# Patient Record
Sex: Male | Born: 1995 | Race: White | Hispanic: No | Marital: Married | Smoking: Never smoker
Health system: Southern US, Community
[De-identification: ages and names within clinical notes are randomized; demographics above are authoritative.]

## PROBLEM LIST (undated history)

## (undated) DIAGNOSIS — Z8669 Personal history of other diseases of the nervous system and sense organs: Secondary | ICD-10-CM

## (undated) DIAGNOSIS — T7840XA Allergy, unspecified, initial encounter: Secondary | ICD-10-CM

## (undated) DIAGNOSIS — D234 Other benign neoplasm of skin of scalp and neck: Secondary | ICD-10-CM

## (undated) HISTORY — PX: TUMOR REMOVAL: SHX12

## (undated) HISTORY — PX: SKIN BIOPSY: SHX1

## (undated) HISTORY — DX: Allergy, unspecified, initial encounter: T78.40XA

## (undated) HISTORY — DX: Other benign neoplasm of skin of scalp and neck: D23.4

## (undated) HISTORY — PX: TYMPANOSTOMY TUBE PLACEMENT: SHX32

---
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
## 2013-05-28 ENCOUNTER — Ambulatory Visit: Payer: Self-pay | Admitting: Emergency Medicine

## 2013-07-21 ENCOUNTER — Emergency Department: Payer: Self-pay | Admitting: Emergency Medicine

## 2013-07-21 LAB — URINALYSIS, COMPLETE
Bacteria: NONE SEEN
Bilirubin,UR: NEGATIVE
Blood: NEGATIVE
Glucose,UR: NEGATIVE mg/dL (ref 0–75)
Ketone: NEGATIVE
Leukocyte Esterase: NEGATIVE
Nitrite: NEGATIVE
PROTEIN: NEGATIVE
Ph: 5 (ref 4.5–8.0)
RBC,UR: <1 /HPF (ref 0–5)
SPECIFIC GRAVITY: 1.024 (ref 1.003–1.030)
SQUAMOUS EPITHELIAL: NONE SEEN
WBC UR: 1 /HPF (ref 0–5)

## 2013-08-03 ENCOUNTER — Ambulatory Visit: Payer: Self-pay | Admitting: Physician Assistant

## 2014-08-06 ENCOUNTER — Ambulatory Visit
Admission: EM | Admit: 2014-08-06 | Discharge: 2014-08-06 | Disposition: A | Discharge status: Home / Self Care | Payer: BLUE CROSS/BLUE SHIELD | Attending: Family Medicine | Admitting: Family Medicine

## 2014-08-06 ENCOUNTER — Ambulatory Visit: Payer: BLUE CROSS/BLUE SHIELD

## 2014-08-06 ENCOUNTER — Other Ambulatory Visit: Payer: Self-pay

## 2014-08-06 DIAGNOSIS — Z79899 Other long term (current) drug therapy: Secondary | ICD-10-CM | POA: Insufficient documentation

## 2014-08-06 DIAGNOSIS — M940 Chondrocostal junction syndrome [Tietze]: Secondary | ICD-10-CM | POA: Insufficient documentation

## 2014-08-06 DIAGNOSIS — R079 Chest pain, unspecified: Secondary | ICD-10-CM | POA: Diagnosis present

## 2014-08-06 DIAGNOSIS — R0789 Other chest pain: Secondary | ICD-10-CM | POA: Diagnosis not present

## 2014-08-06 LAB — CKMB (ARMC ONLY): CK, MB: 1.3 ng/mL (ref 0.5–5.0)

## 2014-08-06 LAB — CBC WITH DIFFERENTIAL/PLATELET
BASOS ABS: 0 10*3/uL (ref 0–0.1)
Basophils Relative: 1 %
Eosinophils Absolute: 0.1 10*3/uL (ref 0–0.7)
Eosinophils Relative: 1 %
HCT: 46.2 % (ref 40.0–52.0)
HEMOGLOBIN: 15.8 g/dL (ref 13.0–18.0)
Lymphocytes Relative: 31 %
Lymphs Abs: 2.5 10*3/uL (ref 1.0–3.6)
MCH: 30.6 pg (ref 26.0–34.0)
MCHC: 34.2 g/dL (ref 32.0–36.0)
MCV: 89.5 fL (ref 80.0–100.0)
MONOS PCT: 10 %
Monocytes Absolute: 0.8 10*3/uL (ref 0.2–1.0)
NEUTROS ABS: 4.7 10*3/uL (ref 1.4–6.5)
Neutrophils Relative %: 57 %
PLATELETS: 275 10*3/uL (ref 150–440)
RBC: 5.16 MIL/uL (ref 4.40–5.90)
RDW: 13.8 % (ref 11.5–14.5)
WBC: 8.2 10*3/uL (ref 3.8–10.6)

## 2014-08-06 LAB — COMPREHENSIVE METABOLIC PANEL
ALK PHOS: 106 U/L (ref 38–126)
ALT: 131 U/L — AB (ref 17–63)
AST: 44 U/L — ABNORMAL HIGH (ref 15–41)
Albumin: 4.5 g/dL (ref 3.5–5.0)
Anion gap: 9 (ref 5–15)
BUN: 16 mg/dL (ref 6–20)
CO2: 26 mmol/L (ref 22–32)
Calcium: 9.8 mg/dL (ref 8.9–10.3)
Chloride: 105 mmol/L (ref 101–111)
Creatinine, Ser: 0.91 mg/dL (ref 0.61–1.24)
GFR calc Af Amer: >60 mL/min (ref >60)
GFR calc non Af Amer: >60 mL/min (ref >60)
Glucose, Bld: 95 mg/dL (ref 65–99)
Potassium: 4.2 mmol/L (ref 3.5–5.1)
Sodium: 140 mmol/L (ref 135–145)
Total Bilirubin: 0.5 mg/dL (ref 0.3–1.2)
Total Protein: 7.4 g/dL (ref 6.5–8.1)

## 2014-08-06 LAB — CK: Total CK: 90 U/L (ref 49–397)

## 2014-08-06 LAB — TROPONIN I: Troponin I: <0.03 ng/mL (ref <0.031)

## 2014-08-06 MED ORDER — KETOROLAC TROMETHAMINE 60 MG/2ML IM SOLN
60.0000 mg | Freq: Once | INTRAMUSCULAR | Status: AC
  Administered 2014-08-06: 60 mg via INTRAMUSCULAR

## 2014-08-06 MED ORDER — MELOXICAM 15 MG PO TABS: 15.0000 mg | ORAL_TABLET | Freq: Every day | ORAL | Status: DC

## 2014-08-06 NOTE — ED Provider Notes (Signed)
CSN: [id number]     Arrival date & time 08/06/14  1629 History   First MD Initiated Contact with Patient 08/06/14 1731     Chief Complaint  Patient presents with  . Chest Pain   (Consider location/radiation/quality/duration/timing/severity/associated sxs/prior Treatment) Patient is a 19 y.o. male presenting with chest pain. The history is provided by the patient. No language interpreter was used.  Chest Pain Pain location:  Substernal area Pain quality: sharp, shooting and stabbing   Pain radiates to:  Does not radiate Pain radiates to the back: no   Pain severity:  Moderate Onset quality:  Sudden Duration:  9 hours Timing:  Constant Progression:  Unchanged Chronicity:  New Context: breathing and movement   Relieved by:  Nothing Worsened by:  Deep breathing, movement and exertion Ineffective treatments:  None tried Associated symptoms: no abdominal pain, no AICD problem, no anorexia, no anxiety, no dizziness, no fatigue, no headache, no heartburn, no palpitations and no shortness of breath   Risk factors: male sex   Risk factors: no aortic disease, no coronary artery disease, no Ehlers-Danlos syndrome, no high cholesterol, no hypertension and no smoking    Patient denies any pertinent medical problems. States the chest pain started this morning. He went to work and was able to finish his shift work today. He works as Dealer. Initially when he presented he told the nurse he was going to sue her if treatment wasn't more prompt. History reviewed. No pertinent past medical history. History reviewed. No pertinent past surgical history. History reviewed. No pertinent family history. History  Substance Use Topics  . Smoking status: Never Smoker   . Smokeless tobacco: Not on file  . Alcohol Use: No    Review of Systems  Constitutional: Negative for fatigue.  Respiratory: Negative for shortness of breath.   Cardiovascular: Positive for chest pain. Negative for palpitations.    Gastrointestinal: Negative for heartburn, abdominal pain and anorexia.  Neurological: Negative for dizziness and headaches.  All other systems reviewed and are negative.   Allergies  Review of patient's allergies indicates no known allergies.  Home Medications   Prior to Admission medications   Medication Sig Start Date End Date Taking? Authorizing Provider  cetirizine (ZYRTEC) 10 MG tablet Take 10 mg by mouth daily.   Yes Historical Provider, MD  fluticasone (FLONASE) 50 MCG/ACT nasal spray Place into both nostrils daily.   Yes Historical Provider, MD  loratadine (CLARITIN) 10 MG tablet Take 10 mg by mouth daily.   Yes Historical Provider, MD  meloxicam (MOBIC) 15 MG tablet Take 1 tablet (15 mg total) by mouth daily. 08/06/14   Frederich Cha, MD   BP 135/107 mmHg  Pulse 112  Temp(Src) 98 F (36.7 C) (Oral)  Resp 17  Ht 5\' 9"  (1.753 m)  Wt 260 lb (117.935 kg)  BMI 38.38 kg/m2  SpO2 99% Physical Exam  Constitutional: He appears well-developed.  Obese white male  HENT:  Head: Normocephalic and atraumatic.  Eyes: Pupils are equal, round, and reactive to light.  Neck: Normal range of motion. No tracheal deviation present. No thyromegaly present.  Cardiovascular: Normal rate, regular rhythm, S1 normal, S2 normal and normal heart sounds.   Pulmonary/Chest: Breath sounds normal. No accessory muscle usage. No respiratory distress. He has no decreased breath sounds. He has no wheezes. He exhibits tenderness.    Musculoskeletal: Normal range of motion. He exhibits no edema.  Lymphadenopathy:    He has no cervical adenopathy.  Neurological: He is alert.  Skin:  Skin is warm and dry.  Psychiatric: His behavior is normal.  Vitals reviewed.   ED Course  Procedures (including critical care time) Labs Review Labs Reviewed  COMPREHENSIVE METABOLIC PANEL - Abnormal; Notable for the following:    AST 44 (*)    ALT 131 (*)    All other components within normal limits  CBC WITH  DIFFERENTIAL/PLATELET  TROPONIN I  CKMB([facility] ONLY)  CK   Results for orders placed or performed during the hospital encounter of 08/06/14  Comprehensive metabolic panel  Result Value Ref Range   Sodium 140 135 - 145 mmol/L   Potassium 4.2 3.5 - 5.1 mmol/L   Chloride 105 101 - 111 mmol/L   CO2 26 22 - 32 mmol/L   Glucose, Bld 95 65 - 99 mg/dL   BUN 16 6 - 20 mg/dL   Creatinine, Ser 0.91 0.61 - 1.24 mg/dL   Calcium 9.8 8.9 - 10.3 mg/dL   Total Protein 7.4 6.5 - 8.1 g/dL   Albumin 4.5 3.5 - 5.0 g/dL   AST 44 (H) 15 - 41 U/L   ALT 131 (H) 17 - 63 U/L   Alkaline Phosphatase 106 38 - 126 U/L   Total Bilirubin 0.5 0.3 - 1.2 mg/dL   GFR calc non Af Amer >60 >60 mL/min   GFR calc Af Amer >60 >60 mL/min   Anion gap 9 5 - 15  CBC with Differential  Result Value Ref Range   WBC 8.2 3.8 - 10.6 K/uL   RBC 5.16 4.40 - 5.90 MIL/uL   Hemoglobin 15.8 13.0 - 18.0 g/dL   HCT 46.2 40.0 - 52.0 %   MCV 89.5 80.0 - 100.0 fL   MCH 30.6 26.0 - 34.0 pg   MCHC 34.2 32.0 - 36.0 g/dL   RDW 13.8 11.5 - 14.5 %   Platelets 275 150 - 440 K/uL   Neutrophils Relative % 57 %   Neutro Abs 4.7 1.4 - 6.5 K/uL   Lymphocytes Relative 31 %   Lymphs Abs 2.5 1.0 - 3.6 K/uL   Monocytes Relative 10 %   Monocytes Absolute 0.8 0.2 - 1.0 K/uL   Eosinophils Relative 1 %   Eosinophils Absolute 0.1 0 - 0.7 K/uL   Basophils Relative 1 %   Basophils Absolute 0.0 0 - 0.1 K/uL  Troponin I  Result Value Ref Range   Troponin I <0.03 <0.031 ng/mL  CKMB([facility] only)  Result Value Ref Range   CK, MB 1.3 0.5 - 5.0 ng/mL  CK  Result Value Ref Range   Total CK 90 49 - 397 U/L    Imaging Review Dg Chest 2 View  08/06/2014   CLINICAL DATA:  Upper anterior chest pain.  Shortness of breath.  EXAM: CHEST  2 VIEW  COMPARISON:  None.  FINDINGS: The heart size and mediastinal contours are within normal limits. Both lungs are clear. The visualized skeletal structures are unremarkable.  IMPRESSION: Normal chest.   Electronically  Signed   By: Lorriane Shire M.D.   On: 08/06/2014 17:35   EKG shows normal sinus rhythm no abnormalities. 60 Toradol IM will be given to see if improvement pain occurs. Will also get cardiac enzymes to make sure nothing was missed. The patient appears to have costochondritis and will need to be placed on anti-inflammatory.   Patient showed marked improvement with 60 mg Toradol IM.    MDM   1. Costochondritis, acute   2. Chest wall pain  Frederich Cha, MD 08/06/14 1901

## 2014-08-06 NOTE — ED Notes (Signed)
C/o upper anterior chest pain since this morning. Non radiating. States is SOB. Color pink, skin warm and dry.

## 2014-08-06 NOTE — Discharge Instructions (Signed)
Chest Pain (Nonspecific) It is often hard to give a diagnosis for the cause of chest pain. There is always a chance that your pain could be related to something serious, such as a heart attack or a blood clot in the lungs. You need to follow up with your doctor. HOME CARE  If antibiotic medicine was given, take it as directed by your doctor. Finish the medicine even if you start to feel better.  For the next few days, avoid activities that bring on chest pain. Continue physical activities as told by your doctor.  Do not use any tobacco products. This includes cigarettes, chewing tobacco, and e-cigarettes.  Avoid drinking alcohol.  Only take medicine as told by your doctor.  Follow your doctor's suggestions for more testing if your chest pain does not go away.  Keep all doctor visits you made. GET HELP IF:  Your chest pain does not go away, even after treatment.  You have a rash with blisters on your chest.  You have a fever. GET HELP RIGHT AWAY IF:   You have more pain or pain that spreads to your arm, neck, jaw, back, or belly (abdomen).  You have shortness of breath.  You cough more than usual or cough up blood.  You have very bad back or belly pain.  You feel sick to your stomach (nauseous) or throw up (vomit).  You have very bad weakness.  You pass out (faint).  You have chills. This is an emergency. Do not wait to see if the problems will go away. Call your local emergency services (911 in [place] Do not drive yourself to the hospital. MAKE SURE YOU:   Understand these instructions.  Will watch your condition.  Will get help right away if you are not doing well or get worse. Document Released: 08/08/2007 Document Revised: 02/24/2013 Document Reviewed: 08/08/2007 [facility] Patient Information 2015 [place], [place]. This information is not intended to replace advice given to you by your health care provider. Make sure you discuss any questions you have with your  health care provider.  Chest Wall Pain Chest wall pain is pain felt in or around the chest bones and muscles. It may take up to 6 weeks to get better. It may take longer if you are active. Chest wall pain can happen on its own. Other times, things like germs, injury, coughing, or exercise can cause the pain. HOME CARE   Avoid activities that make you tired or cause pain. Try not to use your chest, belly (abdominal), or side muscles. Do not use heavy weights.  Put ice on the sore area.  Put ice in a plastic bag.  Place a towel between your skin and the bag.  Leave the ice on for 15-20 minutes for the first 2 days.  Only take medicine as told by your doctor. GET HELP RIGHT AWAY IF:   You have more pain or are very uncomfortable.  You have a fever.  Your chest pain gets worse.  You have new problems.  You feel sick to your stomach (nauseous) or throw up (vomit).  You start to sweat or feel lightheaded.  You have a cough with mucus (phlegm).  You cough up blood. MAKE SURE YOU:   Understand these instructions.  Will watch your condition.  Will get help right away if you are not doing well or get worse. Document Released: 08/08/2007 Document Revised: 05/14/2011 Document Reviewed: 10/16/2010 [facility] Patient Information 2015 [place], [place]. This information is not intended to  replace advice given to you by your health care provider. Make sure you discuss any questions you have with your health care provider. ° °

## 2014-10-28 ENCOUNTER — Ambulatory Visit
Admission: EM | Admit: 2014-10-28 | Discharge: 2014-10-28 | Disposition: A | Discharge status: Home / Self Care | Payer: Self-pay | Attending: Emergency Medicine | Admitting: Emergency Medicine

## 2014-10-28 DIAGNOSIS — Z0289 Encounter for other administrative examinations: Secondary | ICD-10-CM

## 2014-10-28 HISTORY — DX: Personal history of other diseases of the nervous system and sense organs: Z86.69

## 2014-10-28 LAB — DEPT OF TRANSP DIPSTICK, URINE (ARMC ONLY)
GLUCOSE, UA: NEGATIVE mg/dL
Hgb urine dipstick: NEGATIVE
PROTEIN: NEGATIVE mg/dL

## 2014-10-28 NOTE — ED Provider Notes (Signed)
CSN: [id number]     Arrival date & time 10/28/14  1317 History   None    Chief Complaint  Patient presents with  . Commercial Driver's License Exam   (Consider location/radiation/quality/duration/timing/severity/associated sxs/prior Treatment) HPI  19 yo WM presents for first DOT exam . 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 Has had tubes bilaterally. Left has been out ,  Right  to be removed shortly by Dr Kathyrn Sheriff. Reports that he recently had hearing tested in preparation for surgery and it was reported as normal.  Did not bring any paperwork. Screening here passed today  Morbidly obese and insists it has never been considered a problem Has PCP, reported up to date Past Medical History  Diagnosis Date  . History of frequent ear infections in childhood    Past Surgical History  Procedure Laterality Date  . Tympanostomy tube placement      bilateral, replaced multiple times; right TM currently plan removal  . Skin biopsy      right neck, benign tumor removed   History reviewed. No pertinent family history. Social History  Substance Use Topics  . Smoking status: Never Smoker   . Smokeless tobacco: None  . Alcohol Use: 0.6 oz/week    1 Cans of beer per week     Comment: social    Review of Systems  Constitutional -afebrile Eyes-denies visual changes ENT- normal voice,denies sore throat; ears as noted CV-denies chest pain Resp-denies SOB GI- negative for nausea,vomiting, diarrhea GU- negative for dysuria MSK- negative for back pain, ambulatory Skin- denies acute changes Neuro- negative headache,focal weakness or numbness   .  Allergies  Review of patient's allergies indicates no known allergies.  Home Medications   Prior to Admission medications   Medication Sig Start Date End Date Taking? Authorizing Provider   fluticasone (FLONASE) 50 MCG/ACT nasal spray Place into both nostrils daily.   Yes Historical Provider, MD  cetirizine (ZYRTEC) 10 MG tablet Take 10 mg by mouth daily.    Historical Provider, MD  loratadine (CLARITIN) 10 MG tablet Take 10 mg by mouth daily.    Historical Provider, MD  meloxicam (MOBIC) 15 MG tablet Take 1 tablet (15 mg total) by mouth daily. 08/06/14   Frederich Cha, MD   Meds Ordered and Administered this Visit  Medications - No data to display  BP 130/89 mmHg  Pulse 94  Temp(Src) 97.7 F (36.5 C) (Tympanic)  Resp 16  Ht 5\' 8"  (1.727 m)  Wt 305 lb (138.347 kg)  BMI 46.39 kg/m2  SpO2 100% No data found.   Physical Exam.  Morbidly obese young Male 5'8"  305 lbs  Constitutional -alert and oriented,well appearing and in no acute distress Head-atraumatic, normocephalic Eyes- conjunctiva normal, EOMI ,conjugate gaze Ears- Right TM tube green visible passing through the TM, wire wrapped.Pending removal shortly. Left TM is scarred from previous tubes- doing well. Nose- mild congestion , no rhinorrhea Mouth/throat- mucous membranes moist ,oropharynx non-erythematous Neck- supple without glandular enlargement CV- regular rate, grossly normal heart sounds,  Resp-no distress, normal respiratory effort,clear to auscultation bilaterally GI- soft,non-tender, protuberant abdomen, no organomegaly identified, no tenderness to palpation , normal BS GU-negative for hernia, circ MSK- no tender, normal ROM, all extremities, ambulatory, self-care. Toe walk, heel walk, squat and return without assist, DTRs and pulses equal and symmetrical Neuro- normal speech and language, no gross  focal neurological deficit appreciated, no gait instability,CN ll-lX as tested grossly WNL Skin-warm,dry ,intact; no rash noted, mild follicle inflammation in panniculus  Psych-mood and affect grossly normal; speech and behavior grossly normal  ED Course  Procedures (including critical care time)  Labs  Review Labs Reviewed  DEPT OF TRANSP DIPSTICK, URINE([facility] ONLY) - Abnormal; Notable for the following:    Specific Gravity, Urine >1.030 (*)    All other components within normal limits   Results for orders placed or performed during the hospital encounter of 10/28/14  Dept of Transp dipstick, urine  Result Value Ref Range   Protein, ur NEGATIVE NEGATIVE mg/dL   Glucose, UA NEGATIVE NEGATIVE mg/dL   Specific Gravity, Urine >1.030 (H) 1.005 - 1.030   Hgb urine dipstick NEGATIVE NEGATIVE    Imaging Review No results found.   Visual Acuity Review  Right Eye Distance:  20/20 Left Eye Distance:  20/20 Bilateral Distance:  20/20  Right Eye Near:   Left Eye Near:    Bilateral Near:      Hearing 5 feet bilateral ears per screening  He denies his weight ever being presented as an issue with his PCP visits. Discussed BP borderline  He reports hx of increased BPs when he is nervous- though we don't have any documentation of same  Strongly encouraged to be focused on healthy food choices and portion control which was reviewed with him Careful choices if fast food "must" be used- no mayo, bacon, fried, cheese or excess salad dressings Encouraged calorie awarenes - all places have printed menus now   MDM   1. Encounter for examination required by Department of Transportation (DOT)     Questions fielded, expectations and recommendations reviewed. Patient expresses understanding. Will return to [facility] with questions or concerns  Jan Fireman, PA-C 10/28/14 1653

## 2014-10-28 NOTE — Discharge Instructions (Signed)
Discussion of weight-- fast foods-- lack of exercise. Encouraged to walk 20 minutes twice daily at a minimum- brisk, continual Encourage to increase to 40 goal  "Every bite matters" discussed

## 2014-10-28 NOTE — ED Notes (Signed)
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

## 2015-08-30 ENCOUNTER — Emergency Department
Admission: EM | Admit: 2015-08-30 | Discharge: 2015-08-30 | Disposition: A | Discharge status: Home / Self Care | Payer: PRIVATE HEALTH INSURANCE | Attending: Emergency Medicine | Admitting: Emergency Medicine

## 2015-08-30 ENCOUNTER — Encounter: Payer: Self-pay | Admitting: Emergency Medicine

## 2015-08-30 ENCOUNTER — Emergency Department: Payer: PRIVATE HEALTH INSURANCE

## 2015-08-30 DIAGNOSIS — N23 Unspecified renal colic: Secondary | ICD-10-CM | POA: Insufficient documentation

## 2015-08-30 DIAGNOSIS — N200 Calculus of kidney: Secondary | ICD-10-CM | POA: Diagnosis not present

## 2015-08-30 DIAGNOSIS — Z7951 Long term (current) use of inhaled steroids: Secondary | ICD-10-CM | POA: Insufficient documentation

## 2015-08-30 DIAGNOSIS — R3916 Straining to void: Secondary | ICD-10-CM | POA: Diagnosis present

## 2015-08-30 LAB — CBC WITH DIFFERENTIAL/PLATELET
Basophils Absolute: 0 10*3/uL (ref 0–0.1)
Basophils Relative: 0 %
EOS ABS: 0 10*3/uL (ref 0–0.7)
EOS PCT: 0 %
HCT: 47.1 % (ref 40.0–52.0)
Hemoglobin: 16.4 g/dL (ref 13.0–18.0)
LYMPHS ABS: 2.1 10*3/uL (ref 1.0–3.6)
LYMPHS PCT: 23 %
MCH: 31 pg (ref 26.0–34.0)
MCHC: 34.8 g/dL (ref 32.0–36.0)
MCV: 89.1 fL (ref 80.0–100.0)
MONO ABS: 0.8 10*3/uL (ref 0.2–1.0)
MONOS PCT: 9 %
Neutro Abs: 6.2 10*3/uL (ref 1.4–6.5)
Neutrophils Relative %: 68 %
PLATELETS: 314 10*3/uL (ref 150–440)
RBC: 5.29 MIL/uL (ref 4.40–5.90)
RDW: 13.5 % (ref 11.5–14.5)
WBC: 9.2 10*3/uL (ref 3.8–10.6)

## 2015-08-30 LAB — BASIC METABOLIC PANEL
Anion gap: 13 (ref 5–15)
BUN: 18 mg/dL (ref 6–20)
CO2: 21 mmol/L — ABNORMAL LOW (ref 22–32)
Calcium: 9.9 mg/dL (ref 8.9–10.3)
Chloride: 105 mmol/L (ref 101–111)
Creatinine, Ser: 1.35 mg/dL — ABNORMAL HIGH (ref 0.61–1.24)
GFR calc Af Amer: >60 mL/min (ref >60)
Glucose, Bld: 120 mg/dL — ABNORMAL HIGH (ref 65–99)
POTASSIUM: 3.8 mmol/L (ref 3.5–5.1)
SODIUM: 139 mmol/L (ref 135–145)

## 2015-08-30 LAB — URINALYSIS COMPLETE WITH MICROSCOPIC (ARMC ONLY)
BILIRUBIN URINE: NEGATIVE
GLUCOSE, UA: NEGATIVE mg/dL
KETONES UR: NEGATIVE mg/dL
LEUKOCYTES UA: NEGATIVE
Nitrite: NEGATIVE
Protein, ur: 30 mg/dL — AB
SPECIFIC GRAVITY, URINE: 1.024 (ref 1.005–1.030)
pH: 5 (ref 5.0–8.0)

## 2015-08-30 MED ORDER — HYDROMORPHONE HCL 2 MG PO TABS: 2.0000 mg | ORAL_TABLET | Freq: Two times a day (BID) | ORAL | Status: DC | PRN

## 2015-08-30 MED ORDER — SODIUM CHLORIDE 0.9 % IV BOLUS (SEPSIS)
1000.0000 mL | Freq: Once | INTRAVENOUS | Status: AC
  Administered 2015-08-30: 1000 mL via INTRAVENOUS

## 2015-08-30 MED ORDER — KETOROLAC TROMETHAMINE 30 MG/ML IJ SOLN
15.0000 mg | Freq: Once | INTRAMUSCULAR | Status: AC
  Administered 2015-08-30: 15 mg via INTRAVENOUS
  Filled 2015-08-30: qty 1

## 2015-08-30 MED ORDER — LORAZEPAM 2 MG/ML IJ SOLN
2.0000 mg | Freq: Once | INTRAMUSCULAR | Status: AC
Start: 2015-08-30 — End: 2015-08-30
  Administered 2015-08-30: 2 mg via INTRAVENOUS
  Filled 2015-08-30: qty 1

## 2015-08-30 MED ORDER — IBUPROFEN 800 MG PO TABS: 800.0000 mg | ORAL_TABLET | Freq: Three times a day (TID) | ORAL | Status: DC

## 2015-08-30 MED ORDER — TAMSULOSIN HCL 0.4 MG PO CAPS: 0.4000 mg | ORAL_CAPSULE | Freq: Every day | ORAL | Status: DC

## 2015-08-30 NOTE — ED Notes (Signed)
MD at bedside. 

## 2015-08-30 NOTE — ED Provider Notes (Signed)
Time Seen: Approximately 1700 I have reviewed the triage notes  Chief Complaint: Nephrolithiasis   History of Present Illness: Timothy Conner is a 20 y.o. male who is been previously diagnosed with a right-sided kidney stone. Review of CAT scan results done through [facility] shows a 3 mm kidney stone on the right ureter patient's had repeat episodes of pain and arrives by private vehicle with an exacerbation of right-sided discomfort. He states this is same pain that he's had from his kidney stone. He denies any fever. He denies any dysuria or hematuria. They apparently haven't had success and follow-up with a urologist at this time.   Past Medical History  Diagnosis Date  . History of frequent ear infections in childhood     There are no active problems to display for this patient.   Past Surgical History  Procedure Laterality Date  . Tympanostomy tube placement      bilateral, replaced multiple times; right TM currently plan removal  . Skin biopsy      right neck, benign tumor removed    Past Surgical History  Procedure Laterality Date  . Tympanostomy tube placement      bilateral, replaced multiple times; right TM currently plan removal  . Skin biopsy      right neck, benign tumor removed    Current Outpatient Rx  Name  Route  Sig  Dispense  Refill  . cetirizine (ZYRTEC) 10 MG tablet   Oral   Take 10 mg by mouth daily.         . fluticasone (FLONASE) 50 MCG/ACT nasal spray   Each Nare   Place into both nostrils daily.         Marland Kitchen loratadine (CLARITIN) 10 MG tablet   Oral   Take 10 mg by mouth daily.         . meloxicam (MOBIC) 15 MG tablet   Oral   Take 1 tablet (15 mg total) by mouth daily.   30 tablet   1     Allergies:  Review of patient's allergies indicates no known allergies.  Family History: No family history on file.  Social History: Social History  Substance Use Topics  . Smoking status: Never Smoker   . Smokeless tobacco: None  . Alcohol  Use: 0.6 oz/week    1 Cans of beer per week     Comment: social     Review of Systems:   10 point review of systems was performed and was otherwise negative:  Constitutional: No fever Eyes: No visual disturbances ENT: No sore throat, ear pain Cardiac: No chest pain Respiratory: No shortness of breath, wheezing, or stridor Abdomen: No abdominal pain, no vomiting, No diarrhea Endocrine: No weight loss, No night sweats Extremities: No peripheral edema, cyanosis Skin: No rashes, easy bruising Neurologic: No focal weakness, trouble with speech or swollowing Urologic: No dysuria, Hematuria, or urinary frequency   Physical Exam:  ED Triage Vitals  Enc Vitals Group     BP 08/30/15 1700 156/115 mmHg     Pulse Rate 08/30/15 1657 109     Resp 08/30/15 1657 26     Temp 08/30/15 1700 97.9 F (36.6 C)     Temp Source 08/30/15 1657 Oral     SpO2 08/30/15 1657 98 %     Weight 08/30/15 1657 280 lb (127.007 kg)     Height 08/30/15 1657 5\' 10"  (1.778 m)     Head Cir --      Peak  Flow --      Pain Score --      Pain Loc --      Pain Edu? --      Excl. in [place]? --     General: Awake , Alert , and Oriented times 3; GCS 15Patient's acting hysterically urine emergency department. Constantly yelling out. Over reaction for his discomfort. Pacing in the room Head: Normal cephalic , atraumatic Eyes: Pupils equal , round, reactive to light Nose/Throat: No nasal drainage, patent upper airway without erythema or exudate.  Neck: Supple, Full range of motion, No anterior adenopathy or palpable thyroid masses Lungs: Clear to ascultation without wheezes , rhonchi, or rales Heart: Regular rate, regular rhythm without murmurs , gallops , or rubs Abdomen: Soft, non tender without rebound, guarding , or rigidity; bowel sounds positive and symmetric in all 4 quadrants. No organomegaly .        Extremities: 2 plus symmetric pulses. No edema, clubbing or cyanosis Neurologic: normal ambulation, Motor  symmetric without deficits, sensory intact Skin: warm, dry, no rashes   Labs:   All laboratory work was reviewed including any pertinent negatives or positives listed below:  Labs Reviewed  BASIC METABOLIC PANEL - Abnormal; Notable for the following:    CO2 21 (*)    Glucose, Bld 120 (*)    Creatinine, Ser 1.35 (*)    All other components within normal limits  CBC WITH DIFFERENTIAL/PLATELET  URINALYSIS COMPLETEWITH MICROSCOPIC ([facility] ONLY)  Urinalysis was positive for red blood cells   Radiology:   DG Abd 1 View (Final result) Result time: 08/30/15 19:21:02   Final result by Rad Results In Interface (08/30/15 19:21:02)   Narrative:   CLINICAL DATA: Kidney stones  EXAM: ABDOMEN - 1 VIEW  COMPARISON: None.  FINDINGS: There is normal small bowel gas pattern. There is a calcification in right pelvis measures 3 mm. Distal right ureteral calculus cannot be excluded.  IMPRESSION: Normal small bowel gas pattern. Calcification in right pelvis measure 3 mm. A distal right ureteral calculus cannot be excluded.   Electronically Signed By: Lahoma Crocker M.D. On: 08/30/2015 19:21           I personally reviewed the radiologic studies     ED Course:  Patient's stay here was uneventful and had symptomatic improvement with IV Toradol, IV Ativan. Patient states he is currently pain-free. The patient has still remaining kidney stone identified and also on his KUB. The patient's had some frustrations with the [facility] system as far as urology follow-up so I called and discussed the case with our on call urologist and agreed to see the patient and is been advised to call them in the morning for follow-up as soon as possible. All questions and concerns were addressed at the bedside and the patient was discharged with prescription for Dilaudid and advised take over-the-counter ibuprofen and continue Flomax.   Assessment:  Renal colic    Plan: * Outpatient Prescription  for Dilaudid and Flomax Patient was advised to return immediately if condition worsens. Patient was advised to follow up with their primary care physician or other specialized physicians involved in their outpatient care. The patient and/or family member/power of attorney had laboratory results reviewed at the bedside. All questions and concerns were addressed and appropriate discharge instructions were distributed by the nursing staff.             Daymon Larsen, MD 08/31/15 [phone]

## 2015-08-30 NOTE — ED Notes (Signed)
Pt dx with kidney stones x1 week; reports hasn't been able to urinate today. Pt diaphoretic in triage, screaming in pain.

## 2015-08-30 NOTE — ED Notes (Signed)
Patient transported to X-ray at this time 

## 2015-08-30 NOTE — ED Notes (Signed)
Pt given ice chips at this time per MD Marcelene Butte okay. Pt tolerating well, NAD noted.

## 2015-08-30 NOTE — Discharge Instructions (Signed)
Kidney Stones °Kidney stones (urolithiasis) are deposits that form inside your kidneys. The intense pain is caused by the stone moving through the urinary tract. When the stone moves, the ureter goes into spasm around the stone. The stone is usually passed in the urine.  °CAUSES  °· A disorder that makes certain neck glands produce too much parathyroid hormone (primary hyperparathyroidism). °· A buildup of uric acid crystals, similar to gout in your joints. °· Narrowing (stricture) of the ureter. °· A kidney obstruction present at birth (congenital obstruction). °· Previous surgery on the kidney or ureters. °· Numerous kidney infections. °SYMPTOMS  °· Feeling sick to your stomach (nauseous). °· Throwing up (vomiting). °· Blood in the urine (hematuria). °· Pain that usually spreads (radiates) to the groin. °· Frequency or urgency of urination. °DIAGNOSIS  °· Taking a history and physical exam. °· Blood or urine tests. °· CT scan. °· Occasionally, an examination of the inside of the urinary bladder (cystoscopy) is performed. °TREATMENT  °· Observation. °· Increasing your fluid intake. °· Extracorporeal shock wave lithotripsy--This is a noninvasive procedure that uses shock waves to break up kidney stones. °· Surgery may be needed if you have severe pain or persistent obstruction. There are various surgical procedures. Most of the procedures are performed with the use of small instruments. Only small incisions are needed to accommodate these instruments, so recovery time is minimized. °The size, location, and chemical composition are all important variables that will determine the proper choice of action for you. Talk to your health care provider to better understand your situation so that you will minimize the risk of injury to yourself and your kidney.  °HOME CARE INSTRUCTIONS  °· Drink enough water and fluids to keep your urine clear or pale yellow. This will help you to pass the stone or stone fragments. °· Strain  all urine through the provided strainer. Keep all particulate matter and stones for your health care provider to see. The stone causing the pain may be as small as a grain of salt. It is very important to use the strainer each and every time you pass your urine. The collection of your stone will allow your health care provider to analyze it and verify that a stone has actually passed. The stone analysis will often identify what you can do to reduce the incidence of recurrences. °· Only take over-the-counter or prescription medicines for pain, discomfort, or fever as directed by your health care provider. °· Keep all follow-up visits as told by your health care provider. This is important. °· Get follow-up X-rays if required. The absence of pain does not always mean that the stone has passed. It may have only stopped moving. If the urine remains completely obstructed, it can cause loss of kidney function or even complete destruction of the kidney. It is your responsibility to make sure X-rays and follow-ups are completed. Ultrasounds of the kidney can show blockages and the status of the kidney. Ultrasounds are not associated with any radiation and can be performed easily in a matter of minutes. °· Make changes to your daily diet as told by your health care provider. You may be told to: °¨ Limit the amount of salt that you eat. °¨ Eat 5 or more servings of fruits and vegetables each day. °¨ Limit the amount of meat, poultry, fish, and eggs that you eat. °· Collect a 24-hour urine sample as told by your health care provider. You may need to collect another urine sample every 6-12   months. SEEK MEDICAL CARE IF:  You experience pain that is progressive and unresponsive to any pain medicine you have been prescribed. SEEK IMMEDIATE MEDICAL CARE IF:   Pain cannot be controlled with the prescribed medicine.  You have a fever or shaking chills.  The severity or intensity of pain increases over 18 hours and is not  relieved by pain medicine.  You develop a new onset of abdominal pain.  You feel faint or pass out.  You are unable to urinate.   This information is not intended to replace advice given to you by your health care provider. Make sure you discuss any questions you have with your health care provider.   Document Released: 02/19/2005 Document Revised: 11/10/2014 Document Reviewed: 07/23/2012 Elsevier Interactive Patient Education [employer].  Please return immediately if condition worsens. Please contact her primary physician or the physician you were given for referral. If you have any specialist physicians involved in her treatment and plan please also contact them. Thank you for using [facility].

## 2015-08-30 NOTE — ED Notes (Signed)
Lab called regarding new orders placed, will run momentarily.

## 2015-09-01 ENCOUNTER — Encounter: Payer: Self-pay | Admitting: Urology

## 2015-09-01 ENCOUNTER — Ambulatory Visit (INDEPENDENT_AMBULATORY_CARE_PROVIDER_SITE_OTHER): Payer: PRIVATE HEALTH INSURANCE | Admitting: Urology

## 2015-09-01 VITALS — BP 129/89 | HR 92 | Ht 69.0 in | Wt 292.3 lb

## 2015-09-01 DIAGNOSIS — R3129 Other microscopic hematuria: Secondary | ICD-10-CM

## 2015-09-01 DIAGNOSIS — N201 Calculus of ureter: Secondary | ICD-10-CM | POA: Diagnosis not present

## 2015-09-01 LAB — URINALYSIS, COMPLETE
BILIRUBIN UA: NEGATIVE
Glucose, UA: NEGATIVE
Ketones, UA: NEGATIVE
Leukocytes, UA: NEGATIVE
NITRITE UA: NEGATIVE
PROTEIN UA: NEGATIVE
Specific Gravity, UA: 1.025 (ref 1.005–1.030)
Urobilinogen, Ur: 0.2 mg/dL (ref 0.2–1.0)
pH, UA: 5.5 (ref 5.0–7.5)

## 2015-09-01 LAB — MICROSCOPIC EXAMINATION: Bacteria, UA: NONE SEEN

## 2015-09-01 MED ORDER — TAMSULOSIN HCL 0.4 MG PO CAPS: 0.4000 mg | ORAL_CAPSULE | Freq: Every day | ORAL | Status: DC

## 2015-09-01 MED ORDER — HYDROMORPHONE HCL 2 MG PO TABS: 2.0000 mg | ORAL_TABLET | Freq: Two times a day (BID) | ORAL | Status: DC | PRN

## 2015-09-01 NOTE — Progress Notes (Signed)
09/01/2015 4:57 PM   Timothy Conner 1995/05/24 [id number]  Referring provider: No referring provider defined for this encounter.  Chief Complaint  Patient presents with  . New Patient (Initial Visit)  . Nephrolithiasis    HPI: Patient is 20 year old Caucasian male who is referred to [place] from [facility] for nephrolithiasis.  Patient states that's over Mother's Day he experienced intense scrotal pain and back pain. He attributed to muscle skeletal issues and took ibuprofen and the pain abated. He then stated 2-1/2 weeks ago the pain returned and then its intensity was so severe he sought treatment at [facility] emergency Department.   A CT scan performed at that time noted a distal 3 mm ureteral stone on the right.  He was discharged with pain medication and instructed that if the stone did not pass in 1 week to contact urology.   The pain then returned Tuesday night and then he was seen at [facility] emergency room.  KUB taken at that time noted a possible right distal ureteral stone. He was told his UA was positive for blood. He is given a prescription for tamsulosin, morphine and ibuprofen.  Today, he states he is still having the pain. He is controlling it with pain medications, but he is frustrated that the stone has not passed. He states he is drinking up to a gallon of water daily and an effort to rid himself of the stone. He was given a strainer and has been straining his urine.  His UA today was positive for 3-10 RBC's per powered field.   PMH: Past Medical History  Diagnosis Date  . History of frequent ear infections in childhood   . Allergy   . 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 Tympanostomy tube placement      bilateral, replaced multiple times; right TM currently plan removal  . Skin biopsy      right neck, benign tumor removed  . Tumor removal       Home Medications:    Medication List       This list is accurate as of: 09/01/15  4:57 PM.  Always use your most recent med list.               fluticasone 50 MCG/ACT nasal spray  Commonly known as:  FLONASE  Place into both nostrils daily.     HYDROmorphone 2 MG tablet  Commonly known as:  DILAUDID  Take 1 tablet (2 mg total) by mouth every 12 (twelve) hours as needed for severe pain.     ibuprofen 800 MG tablet  Commonly known as:  ADVIL,MOTRIN  Take 1 tablet (800 mg total) by mouth 3 (three) times daily.     morphine 30 MG tablet  Commonly known as:  MSIR  Take 30 mg by mouth every 4 (four) hours as needed. Reported on 09/01/2015     tamsulosin 0.4 MG Caps capsule  Commonly known as:  FLOMAX  Take 1 capsule (0.4 mg total) by mouth daily.        Allergies: No Known Allergies  Family History: Family History  Problem Relation Age of Onset  . Prostate cancer Maternal Grandfather   . Kidney cancer Neg Hx   . Kidney disease Neg Hx     Social History:  reports that he has never smoked. He does not have any smokeless tobacco history  on file. He reports that he drinks about 0.6 oz of alcohol per week. He reports that he does not use illicit drugs.  ROS: UROLOGY Frequent Urination?: No Hard to postpone urination?: No Burning/pain with urination?: No Get up at night to urinate?: No Leakage of urine?: No Urine stream starts and stops?: No Trouble starting stream?: No Do you have to strain to urinate?: No Blood in urine?: Yes Urinary tract infection?: No Sexually transmitted disease?: No Injury to kidneys or bladder?: No Painful intercourse?: No Weak stream?: No Erection problems?: No Penile pain?: No  Gastrointestinal Nausea?: Yes Vomiting?: No Indigestion/heartburn?: No Diarrhea?: No Constipation?: No  Constitutional Fever: No Night sweats?: No Weight loss?: No Fatigue?: No  Skin Skin rash/lesions?: No Itching?: No  Eyes Blurred vision?:  No Double vision?: No  Ears/Nose/Throat Sore throat?: No Sinus problems?: Yes  Hematologic/Lymphatic Swollen glands?: No Easy bruising?: No  Cardiovascular Leg swelling?: No Chest pain?: No  Respiratory Cough?: No Shortness of breath?: No  Endocrine Excessive thirst?: No  Musculoskeletal Back pain?: No Joint pain?: No  Neurological Headaches?: No Dizziness?: No  Psychologic Depression?: No Anxiety?: No  Physical Exam: BP 129/89 mmHg  Pulse 92  Ht 5' 9"  (1.753 m)  Wt 292 lb 4.8 oz (132.586 kg)  BMI 43.15 kg/m2  Constitutional: Well nourished. Alert and oriented, No acute distress. HEENT: [place] AT, moist mucus membranes. Trachea midline, no masses. Cardiovascular: No clubbing, cyanosis, or edema. Respiratory: Normal respiratory effort, no increased work of breathing. GI: Abdomen is soft, non tender, non distended, no abdominal masses. Liver and spleen not palpable.  No hernias appreciated.  Stool sample for occult testing is not indicated.   GU: No CVA tenderness.  No bladder fullness or masses.  Deferred. Rectal: Deferred. Skin: No rashes, bruises or suspicious lesions. Lymph: No cervical or inguinal adenopathy. Neurologic: Grossly intact, no focal deficits, moving all 4 extremities. Psychiatric: Normal mood and affect.  Laboratory Data: Lab Results  Component Value Date   WBC 9.2 08/30/2015   HGB 16.4 08/30/2015   HCT 47.1 08/30/2015   MCV 89.1 08/30/2015   PLT 314 08/30/2015    Lab Results  Component Value Date   CREATININE 1.35* 08/30/2015    Lab Results  Component Value Date   AST 44* 08/06/2014   Lab Results  Component Value Date   ALT 131* 08/06/2014     Urinalysis Results for orders placed or performed in visit on 09/01/15  Microscopic Examination  Result Value Ref Range   WBC, UA 0-5 0 -  5 /hpf   RBC, UA 3-10 (A) 0 -  2 /hpf   Epithelial Cells (non renal) 0-10 0 - 10 /hpf   Bacteria, UA None seen None seen/Few  Urinalysis,  Complete  Result Value Ref Range   Specific Gravity, UA 1.025 1.005 - 1.030   pH, UA 5.5 5.0 - 7.5   Color, UA Yellow Yellow   Appearance Ur Clear Clear   Leukocytes, UA Negative Negative   Protein, UA Negative Negative/Trace   Glucose, UA Negative Negative   Ketones, UA Negative Negative   RBC, UA Trace (A) Negative   Bilirubin, UA Negative Negative   Urobilinogen, Ur 0.2 0.2 - 1.0 mg/dL   Nitrite, UA Negative Negative   Microscopic Examination See below:     Pertinent Imaging: EXAM: CT abdomen and pelvis without contrast DATE: 08/16/2015 4:58 PM ACCESSION: [id number] UN DICTATED: 08/16/2015 5:06 PM INTERPRETATION LOCATION: [place]  CLINICAL INDICATION: 20 years old Male with CALCULUS OF  URETER--    COMPARISON: None  TECHNIQUE: A spiral CT scan was obtained without IV contrast from the lung bases to the pubic symphysis.  Images were reconstructed in the axial plane. Coronal and sagittal reformatted images were also provided for further evaluation.  FINDINGS: Evaluation of the solid organs and vasculature is limited in the absence of intravenous contrast.  LINES/DEVICES: None.  LOWER CHEST: Unremarkable.  ABDOMEN/PELVIS:  HEPATOBILIARY: Diffuse hepatic steatosis with sparing along the gallbladder fossa. No biliary ductal dilatation. Gallbladder is unremarkable. PANCREAS: Unremarkable. SPLEEN: Unremarkable. Small adjacent splenule. ADRENAL GLANDS: Unremarkable. KIDNEYS/URETERS: Kidneys are unremarkable without evidence for hydronephrosis. There is a 3 mm nonobstructing distal right ureteral stone (2:83). No ureteral dilation or periureteral stranding. BLADDER: Unremarkable. BOWEL/PERITONEUM/RETROPERITONEUM: No bowel obstruction. No acute inflammatory process. No ascites. Appendix is unremarkable. VASCULATURE: Abdominal aorta within normal limits for patient's age. Unremarkable inferior vena cava. LYMPH NODES: No adenopathy. REPRODUCTIVE ORGANS:  Unremarkable.  BONES/SOFT TISSUES: Unremarkable.   IMPRESSION: - 3 mm nonobstructing distal right ureteral stone. No hydronephrosis. - Diffuse hepatic steatosis with sparing along the gallbladder fossa.   Assessment & Plan:    Patient will undergo right ureteroscopic be with laser lithotripsy with ureteral stent placement for definitive treatment for a 3 mm nonobstructing distal right ureteral stone.  1. Right ureteral stone:   We discussed MET, ESWL and URS/LL/ureteral stent placement for definitive treatment of the right distal ureteral stone.  I expressed to the patient that I favored MET due to the size and location of the stone.  He stated he no longer wanted to wait and wanted to have the stone removed.  We discussed the success rate of ESWL and URS/LL/ureteral stent placement and the risks involved with each procedure.  He would like to proceed with URS/LL/ureteral stent placement.  I explained to the patient how the procedure is performed and the risks involved.    I informed patient that she will have a stent placed during the procedure and will remain in place after the procedure for a short time.  It will be removed in the office with a cystoscope, unless a string in left in place.  I informed that patient that about 50% of patients who undergo ureteroscopy and have a stent will have "stent pain," and this is by far the most common risk/complaint following ureteroscopy. A stent is a soft plastic tube (about half the size of IV tubing) that allows the kidney to drain to the bladder regardless of edema or obstruction. Not only can the stent "rub" on the inside of the bladder, causing a feeling of needing to urinate/overactive bladder, but also the stent allows urine to pass up from the bladder to the kidney during urination - causing symptoms from a warm, tingling sensation to intense pain in the affected flank.   They may be residual stones within the kidney or ureter may be present up to  40% of the time following ureteroscopy, depending on the original stone size and location. These stone fragments will be seen and addressed on follow-up imaging.  Injury to the ureter is the most common intra-operative complication during ureteroscopy. The reported risk of perforation ranges greatly, depending on whether it is defined as a complete perforation (0.1-0.7% - think of this as a hole through the entire ureter), a partial perforation (1.6% - a hole nearly through the entire ureter), or mucosal tear/scrape (5% - these are similar to a sore on the inside of the mouth). Almost 100% of these will heal  with prolonged stenting (anywhere between 2 - 4 weeks). Should a large perforation occur, your urologist may chose to stop the procedure and return on another day when the ureter has had time to heal.  I also explained the risks of general anesthesia, such as: MI, CVA, paralysis, coma and/or death.  - Urinalysis, Complete  Patient will be out of town, so his procedure will be scheduled on July 24. He will return prior to this time for repeat UA and urine culture.   2. Microscopic hematuria:   We will continue to monitor the patient's UA after the treatment/passage of the stone to ensure the hematuria has resolved.  If hematuria persists, we will pursue a hematuria workup with CT Urogram and cystoscopy if appropriate.   Return for right ureteroscopy with laser lithotripsy with ureteral stent placement.  These notes generated with voice recognition software. I apologize for typographical errors.  Zara Council, [facility] [address] [place], [place] [address] [phone]

## 2015-09-02 ENCOUNTER — Telehealth: Payer: Self-pay | Admitting: Radiology

## 2015-09-02 NOTE — Telephone Encounter (Signed)
Notified pt of surgery scheduled 09/27/15, pre-admit testing phone interview on 09/09/15 between 9am-1pm & to call the day prior to surgery for arrival time to SDS. Advised pt to go to pre-admit testing office on 09/09/15 for ua & ucx. Pt voices understanding.

## 2015-09-09 ENCOUNTER — Other Ambulatory Visit: Payer: Self-pay

## 2015-09-15 NOTE — Telephone Encounter (Signed)
Heather from pre-admit testing called stating pt has not returned her call regarding surgery information & did not have ua & culture collected prior to going out of town. Attempted to call pt but unable to LM d/t mailbox was full. Pt will be out of town from 09/12/15-09/24/15. Surgery is scheduled 09/27/15. Will continue to attempt to contact pt.

## 2015-09-16 NOTE — Telephone Encounter (Signed)
Pt called stating he passed the stone this morning. Per Dr Pilar Jarvis, advised pt that he will need a KUB and f/u appt with [facility] when he returns. Pt states he will call to schedule when he gets back to town.

## 2015-09-27 ENCOUNTER — Encounter: Admission: RE | Payer: Self-pay | Source: Ambulatory Visit

## 2015-09-27 ENCOUNTER — Ambulatory Visit: Admission: RE | Admit: 2015-09-27 | Payer: PRIVATE HEALTH INSURANCE | Source: Ambulatory Visit | Admitting: Urology

## 2015-09-27 SURGERY — CYSTOSCOPY/URETEROSCOPY/HOLMIUM LASER/STENT PLACEMENT
Anesthesia: Choice | Laterality: Right

## 2015-09-29 NOTE — Telephone Encounter (Signed)
sw pt regarding scheduling f/u appt with [facility] with KUB prior. Pt states he will c/b to schedule because he is at work.

## 2015-09-30 NOTE — Telephone Encounter (Signed)
No answer, unable to LM, voicemail full

## 2015-10-21 NOTE — Telephone Encounter (Signed)
sw pt regarding scheduling f/u appt with [facility] with KUB prior. Pt states he will c/b to schedule because he travels with work & isn't able to know in advance if he will be available for an appt.

## 2017-03-17 IMAGING — CR DG ABDOMEN 1V
2 series · 2 of 2 positions shown · non-contrast
Comparison: None.

CLINICAL DATA: Kidney stones

EXAM:
ABDOMEN - 1 VIEW

[abdomen kub (1 of 2)]
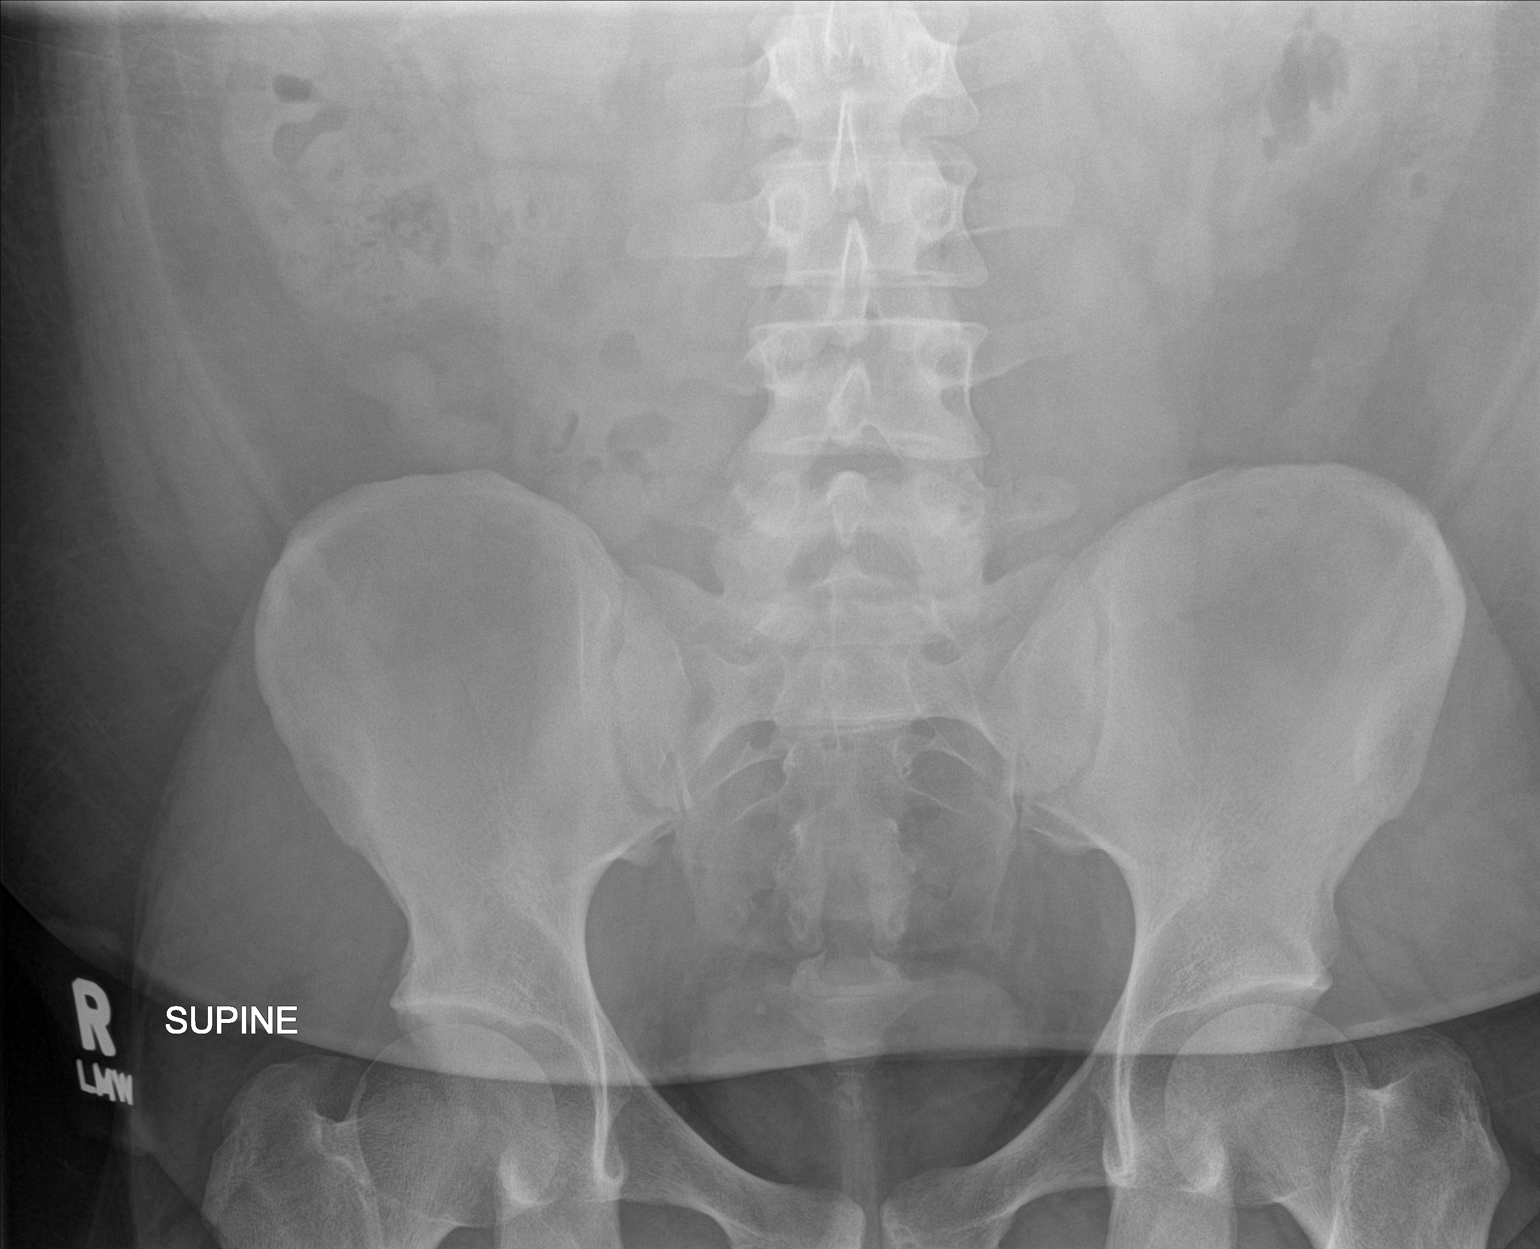

[abdomen kub (2 of 2)]
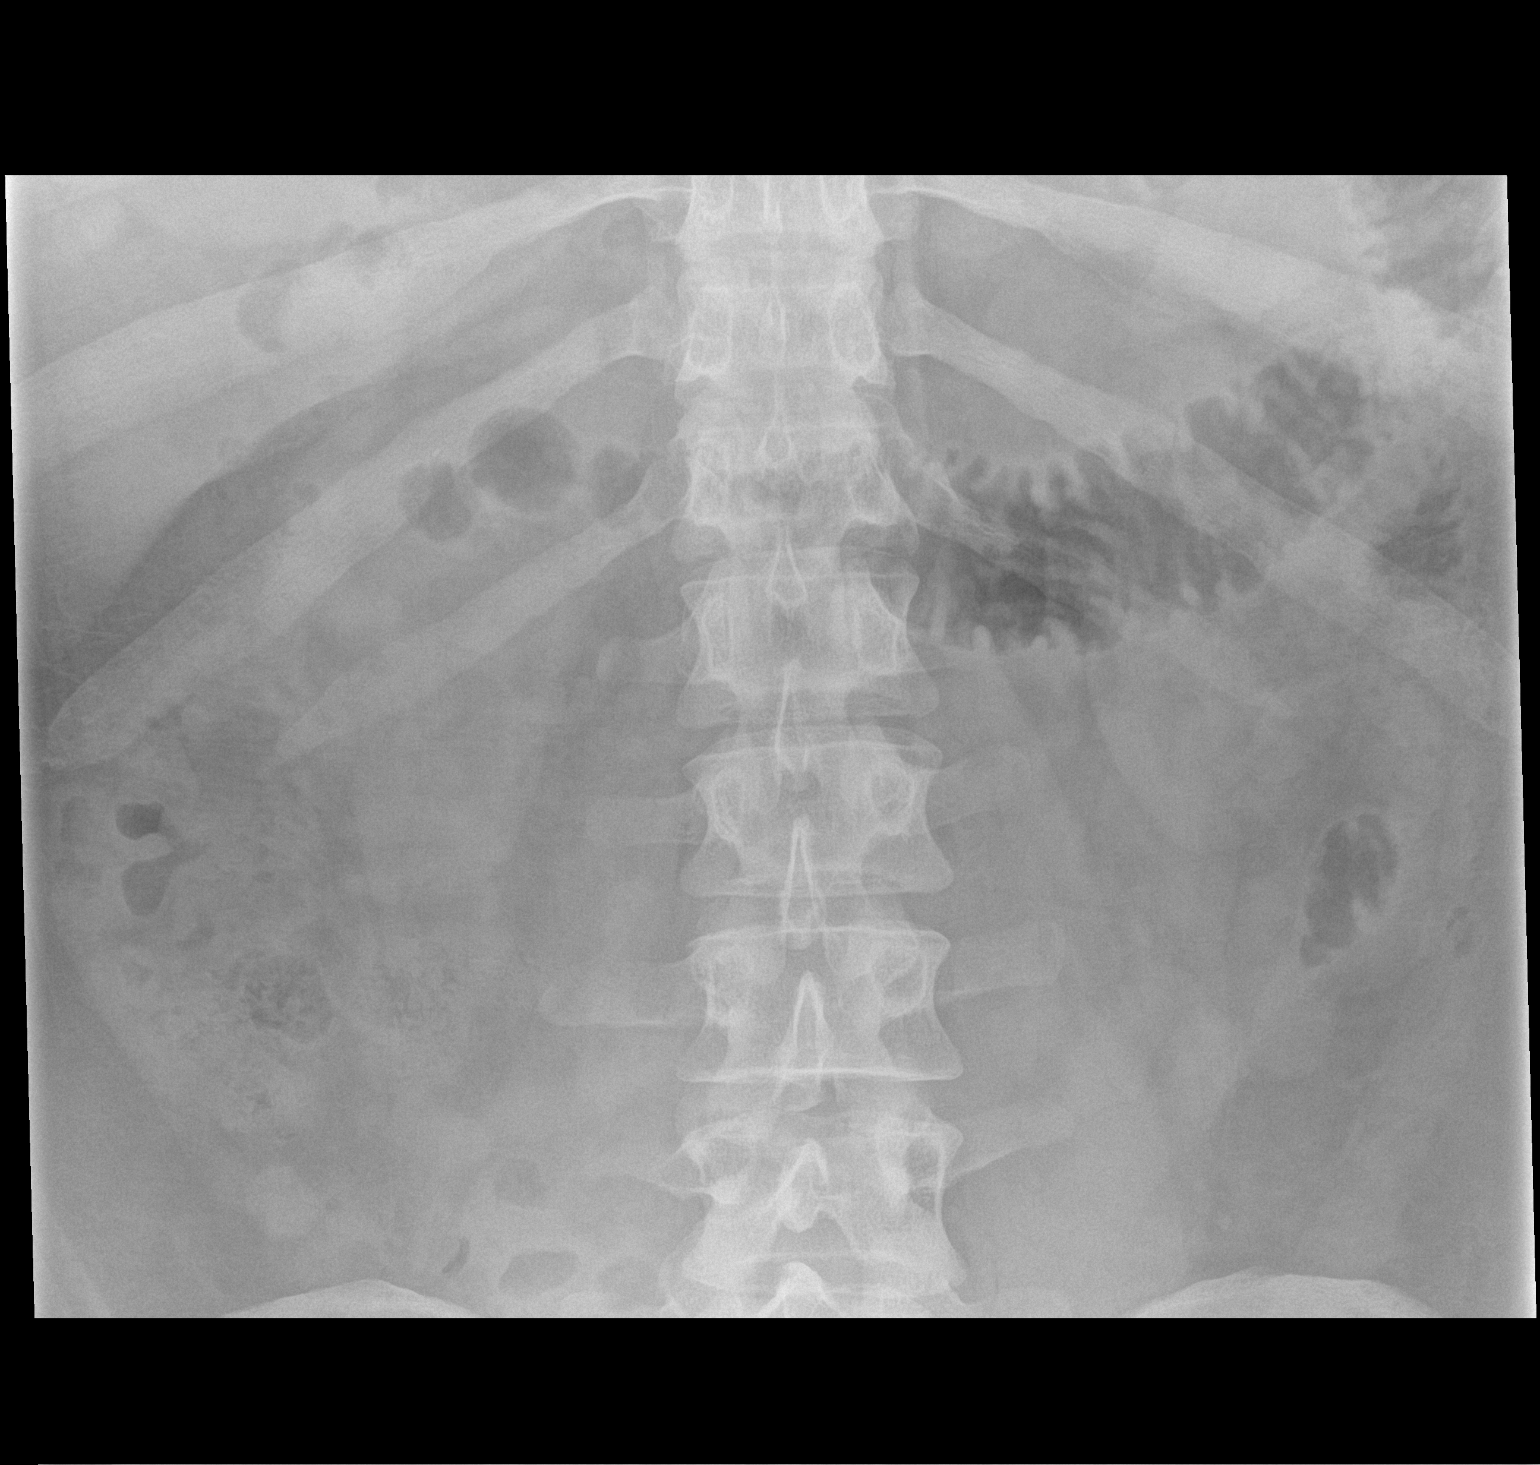

[2 of 2 positions shown; findings below may reference images not displayed]

FINDINGS: There is normal small bowel gas pattern. There is a calcification in
right pelvis measures 3 mm. Distal right ureteral calculus cannot be
excluded.
IMPRESSION: Normal small bowel gas pattern. Calcification in right pelvis
measure 3 mm. A distal right ureteral calculus cannot be excluded.

## 2021-03-09 DIAGNOSIS — J3489 Other specified disorders of nose and nasal sinuses: Secondary | ICD-10-CM | POA: Diagnosis not present

## 2021-03-09 DIAGNOSIS — H6980 Other specified disorders of Eustachian tube, unspecified ear: Secondary | ICD-10-CM | POA: Diagnosis not present

## 2021-03-09 DIAGNOSIS — J342 Deviated nasal septum: Secondary | ICD-10-CM | POA: Diagnosis not present

## 2021-03-09 DIAGNOSIS — J301 Allergic rhinitis due to pollen: Secondary | ICD-10-CM | POA: Diagnosis not present

## 2021-03-09 DIAGNOSIS — J343 Hypertrophy of nasal turbinates: Secondary | ICD-10-CM | POA: Diagnosis not present

## 2021-03-14 ENCOUNTER — Encounter: Payer: Self-pay | Admitting: Otolaryngology

## 2021-03-17 NOTE — Discharge Instructions (Signed)
[facility]  What is Functional Endoscopic Sinus Surgery?  The Surgery involves making the natural openings of the sinuses larger by removing the bony partitions that separate the sinuses from the nasal cavity.  The natural sinus lining is preserved as much as possible to allow the sinuses to resume normal function after the surgery.  In some patients nasal polyps (excessively swollen lining of the sinuses) may be removed to relieve obstruction of the sinus openings.  The surgery is performed through the nose using lighted scopes, which eliminates the need for incisions on the face.  A septoplasty is a different procedure which is sometimes performed with sinus surgery.  It involves straightening the boy partition that separates the two sides of your nose.  A crooked or deviated septum may need repair if is obstructing the sinuses or nasal airflow.  Turbinate reduction is also often performed during sinus surgery.  The turbinates are bony proturberances from the side walls of the nose which swell and can obstruct the nose in patients with sinus and allergy problems.  Their size can be surgically reduced to help relieve nasal obstruction.  What Can Sinus Surgery Do For Me?  Sinus surgery can reduce the frequency of sinus infections requiring antibiotic treatment.  This can provide improvement in nasal congestion, post-nasal drainage, facial pressure and nasal obstruction.  Surgery will NOT prevent you from ever having an infection again, so it usually only for patients who get infections 4 or more times yearly requiring antibiotics, or for infections that do not clear with antibiotics.  It will not cure nasal allergies, so patients with allergies may still require medication to treat their allergies after surgery. Surgery may improve headaches related to sinusitis, however, some people will continue to  require medication to control sinus headaches related to allergies.  Surgery will do nothing for other forms of headache (migraine, tension or cluster).  What Are the Risks of Endoscopic Sinus Surgery?  Current techniques allow surgery to be performed safely with little risk, however, there are rare complications that patients should be aware of.  Because the sinuses are located around the eyes, there is risk of eye injury, including blindness, though again, this would be quite rare. This is usually a result of bleeding behind the eye during surgery, which can effect vision, though there are treatments to protect the vision and prevent permanent injury. More serious complications would include bleeding inside the brain cavity or damage to the brain.This happens when the fluid around the brain leaks out into the sinus cavity.  Again, all of these complications are uncommon, and spinal fluid leaks can be safely managed surgically if they occur.  The most common complication of sinus surgery is bleeding from the nose, which may require packing or cauterization of the nose.  Patients with polyps may experience recurrence of the polyps that would require revision surgery.  Alterations of sense of smell or injury to the tear ducts are also rare complications.   What is the Surgery Like, and what is the Recovery?  The Surgery usually takes a couple of hours to perform, and is usually performed under a general anesthetic (completely asleep).  Patients are usually discharged home after a couple of hours.  Sometimes during surgery it is necessary to pack the nose to control bleeding, and the packing is left in place for 24 - 48 hours, and removed by your surgeon.  If  a septoplasty was performed during the procedure, there is often a splint placed which must be removed after 5-7 days.   Discomfort: Pain is usually mild to moderate, and can be controlled by prescription pain medication or acetaminophen (Tylenol).   Aspirin, Ibuprofen (Advil, Motrin), or Naprosyn (Aleve) should be avoided, as they can cause increased bleeding.  Most patients feel sinus pressure like they have a bad head cold for several days.  Sleeping with your head elevated can help reduce swelling and facial pressure, as can ice packs over the face.  A humidifier may be helpful to keep the mucous and blood from drying in the nose.   Diet: There are no specific diet restrictions, however, you should generally start with clear liquids and a light diet of bland foods because the anesthetic can cause some nausea.  Advance your diet depending on how your stomach feels.  Taking your pain medication with food will often help reduce stomach upset which pain medications can cause.  Nasal Saline Irrigation: It is important to remove blood clots and dried mucous from the nose as it is healing.  This is done by having you irrigate the nose at least 3 - 4 times daily with a salt water solution.  We recommend using NeilMed Sinus Rinse (available at the drug store).  Fill the squeeze bottle with the solution, bend over a sink, and insert the tip of the squeeze bottle into the nose  of an inch.  Point the tip of the squeeze bottle towards the inside corner of the eye on the same side your irrigating.  Squeeze the bottle and gently irrigate the nose.  If you bend forward as you do this, most of the fluid will flow back out of the nose, instead of down your throat.   The solution should be warm, near body temperature, when you irrigate.   Each time you irrigate, you should use a full squeeze bottle.   Note that if you are instructed to use Nasal Steroid Sprays at any time after your surgery, irrigate with saline BEFORE using the steroid spray, so you do not wash it all out of the nose. Another product, Nasal Saline Gel (such as AYR Nasal Saline Gel) can be applied in each nostril 3 - 4 times daily to moisture the nose and reduce scabbing or crusting.  Bleeding:   Bloody drainage from the nose can be expected for several days, and patients are instructed to irrigate their nose frequently with salt water to help remove mucous and blood clots.  The drainage may be dark red or brown, though some fresh blood may be seen intermittently, especially after irrigation.  Do not blow you nose, as bleeding may occur. If you must sneeze, keep your mouth open to allow air to escape through your mouth.  If heavy bleeding occurs: Irrigate the nose with saline to rinse out clots, then spray the nose 3 - 4 times with Afrin Nasal Decongestant Spray.  The spray will constrict the blood vessels to slow bleeding.  Pinch the lower half of your nose shut to apply pressure, and lay down with your head elevated.  Ice packs over the nose may help as well. If bleeding persists despite these measures, you should notify your doctor.  Do not use the Afrin routinely to control nasal congestion after surgery, as it can result in worsening congestion and may affect healing.     Activity: Return to work varies among patients. Most patients will be out  of work at least 5 - 7 days to recover.  Patient may return to work after they are off of narcotic pain medication, and feeling well enough to perform the functions of their job.  Patients must avoid heavy lifting (over 10 pounds) or strenuous physical for 2 weeks after surgery, so your employer may need to assign you to light duty, or keep you out of work longer if light duty is not possible.  NOTE: you should not drive, operate dangerous machinery, do any mentally demanding tasks or make any important legal or financial decisions while on narcotic pain medication and recovering from the general anesthetic.    Call Your Doctor Immediately if You Have Any of the Following: Bleeding that you cannot control with the above measures Loss of vision, double vision, bulging of the eye or black eyes. Fever over 101 degrees Neck stiffness with severe headache,  fever, nausea and change in mental state. You are always encouraged to call anytime with concerns, however, please call with requests for pain medication refills during office hours.  Office Endoscopy: During follow-up visits your doctor will remove any packing or splints that may have been placed and evaluate and clean your sinuses endoscopically.  Topical anesthetic will be used to make this as comfortable as possible, though you may want to take your pain medication prior to the visit.  How often this will need to be done varies from patient to patient.  After complete recovery from the surgery, you may need follow-up endoscopy from time to time, particularly if there is concern of recurrent infection or nasal polyps.  [facility]  What is Functional Endoscopic Sinus Surgery?  The Surgery involves making the natural openings of the sinuses larger by removing the bony partitions that separate the sinuses from the nasal cavity.  The natural sinus lining is preserved as much as possible to allow the sinuses to resume normal function after the surgery.  In some patients nasal polyps (excessively swollen lining of the sinuses) may be removed to relieve obstruction of the sinus openings.  The surgery is performed through the nose using lighted scopes, which eliminates the need for incisions on the face.  A septoplasty is a different procedure which is sometimes performed with sinus surgery.  It involves straightening the boy partition that separates the two sides of your nose.  A crooked or deviated septum may need repair if is obstructing the sinuses or nasal airflow.  Turbinate reduction is also often performed during sinus surgery.  The turbinates are bony proturberances from the side walls of the nose which swell and can obstruct the nose in patients with sinus and allergy problems.  Their size can be  surgically reduced to help relieve nasal obstruction.  What Can Sinus Surgery Do For Me?  Sinus surgery can reduce the frequency of sinus infections requiring antibiotic treatment.  This can provide improvement in nasal congestion, post-nasal drainage, facial pressure and nasal obstruction.  Surgery will NOT prevent you from ever having an infection again, so it usually only for patients who get infections 4 or more times yearly requiring antibiotics, or for infections that do not clear with antibiotics.  It will not cure nasal allergies, so patients with allergies may still require medication to treat their allergies after surgery. Surgery may improve headaches related to sinusitis, however, some people will continue to require medication to control sinus headaches related to allergies.  Surgery will do nothing for other forms of headache (migraine, tension or cluster).  What Are the Risks of Endoscopic Sinus Surgery?  Current techniques allow surgery to be performed safely with little risk, however, there are rare complications that patients should be aware of.  Because the sinuses are located around the eyes, there is risk of eye injury, including blindness, though again, this would be quite rare. This is usually a result of bleeding behind the eye during surgery, which can effect vision, though there are treatments to protect the vision and prevent permanent injury. More serious complications would include bleeding inside the brain cavity or damage to the brain.This happens when the fluid around the brain leaks out into the sinus cavity.  Again, all of these complications are uncommon, and spinal fluid leaks can be safely managed surgically if they occur.  The most common complication of sinus surgery is bleeding from the nose, which may require packing or cauterization of the nose.  Patients with polyps may experience recurrence of the polyps that would require revision surgery.  Alterations of sense of  smell or injury to the tear ducts are also rare complications.   What is the Surgery Like, and what is the Recovery?  The Surgery usually takes a couple of hours to perform, and is usually performed under a general anesthetic (completely asleep).  Patients are usually discharged home after a couple of hours.  Sometimes during surgery it is necessary to pack the nose to control bleeding, and the packing is left in place for 24 - 48 hours, and removed by your surgeon.  If a septoplasty was performed during the procedure, there is often a splint placed which must be removed after 5-7 days.   Discomfort: Pain is usually mild to moderate, and can be controlled by prescription pain medication or acetaminophen (Tylenol).  Aspirin, Ibuprofen (Advil, Motrin), or Naprosyn (Aleve) should be avoided, as they can cause increased bleeding.  Most patients feel sinus pressure like they have a bad head cold for several days.  Sleeping with your head elevated can help reduce swelling and facial pressure, as can ice packs over the face.  A humidifier may be helpful to keep the mucous and blood from drying in the nose.   Diet: There are no specific diet restrictions, however, you should generally start with clear liquids and a light diet of bland foods because the anesthetic can cause some nausea.  Advance your diet depending on how your stomach feels.  Taking your pain medication with food will often help reduce stomach upset which pain medications can cause.  Nasal Saline Irrigation: It is important to remove blood clots and dried mucous from the nose as it is healing.  This is done by having you irrigate the nose at least 3 - 4 times daily with a salt water solution.  We recommend using NeilMed Sinus Rinse (available at the drug store).  Fill the squeeze bottle with the solution, bend over a sink, and insert the tip of the squeeze bottle into the nose  of an inch.  Point the tip of the squeeze bottle towards the inside  corner of the eye on the same side your irrigating.  Squeeze the bottle and gently irrigate the nose.  If you bend forward as you do this, most of the fluid will flow back out of the nose, instead of down your throat.   The solution should be warm, near body temperature, when you irrigate.   Each time  you irrigate, you should use a full squeeze bottle.   Note that if you are instructed to use Nasal Steroid Sprays at any time after your surgery, irrigate with saline BEFORE using the steroid spray, so you do not wash it all out of the nose. Another product, Nasal Saline Gel (such as AYR Nasal Saline Gel) can be applied in each nostril 3 - 4 times daily to moisture the nose and reduce scabbing or crusting.  Bleeding:  Bloody drainage from the nose can be expected for several days, and patients are instructed to irrigate their nose frequently with salt water to help remove mucous and blood clots.  The drainage may be dark red or brown, though some fresh blood may be seen intermittently, especially after irrigation.  Do not blow you nose, as bleeding may occur. If you must sneeze, keep your mouth open to allow air to escape through your mouth.  If heavy bleeding occurs: Irrigate the nose with saline to rinse out clots, then spray the nose 3 - 4 times with Afrin Nasal Decongestant Spray.  The spray will constrict the blood vessels to slow bleeding.  Pinch the lower half of your nose shut to apply pressure, and lay down with your head elevated.  Ice packs over the nose may help as well. If bleeding persists despite these measures, you should notify your doctor.  Do not use the Afrin routinely to control nasal congestion after surgery, as it can result in worsening congestion and may affect healing.     Activity: Return to work varies among patients. Most patients will be out of work at least 5 - 7 days to recover.  Patient may return to work after they are off of narcotic pain medication, and feeling well enough  to perform the functions of their job.  Patients must avoid heavy lifting (over 10 pounds) or strenuous physical for 2 weeks after surgery, so your employer may need to assign you to light duty, or keep you out of work longer if light duty is not possible.  NOTE: you should not drive, operate dangerous machinery, do any mentally demanding tasks or make any important legal or financial decisions while on narcotic pain medication and recovering from the general anesthetic.    Call Your Doctor Immediately if You Have Any of the Following: Bleeding that you cannot control with the above measures Loss of vision, double vision, bulging of the eye or black eyes. Fever over 101 degrees Neck stiffness with severe headache, fever, nausea and change in mental state. You are always encouraged to call anytime with concerns, however, please call with requests for pain medication refills during office hours.  Office Endoscopy: During follow-up visits your doctor will remove any packing or splints that may have been placed and evaluate and clean your sinuses endoscopically.  Topical anesthetic will be used to make this as comfortable as possible, though you may want to take your pain medication prior to the visit.  How often this will need to be done varies from patient to patient.  After complete recovery from the surgery, you may need follow-up endoscopy from time to time, particularly if there is concern of recurrent infection or nasal polyps.

## 2021-03-23 ENCOUNTER — Ambulatory Visit
Admission: RE | Admit: 2021-03-23 | Discharge: 2021-03-23 | Disposition: A | Discharge status: Home / Self Care | Payer: 59 | Attending: Otolaryngology | Admitting: Otolaryngology

## 2021-03-23 ENCOUNTER — Ambulatory Visit: Payer: 59 | Admitting: Anesthesiology

## 2021-03-23 ENCOUNTER — Encounter
Admission: RE | Disposition: A | Discharge status: Home / Self Care | Payer: Self-pay | Source: Home / Self Care | Attending: Otolaryngology

## 2021-03-23 ENCOUNTER — Encounter: Payer: Self-pay | Admitting: Otolaryngology

## 2021-03-23 ENCOUNTER — Other Ambulatory Visit: Payer: Self-pay

## 2021-03-23 DIAGNOSIS — J342 Deviated nasal septum: Secondary | ICD-10-CM | POA: Diagnosis not present

## 2021-03-23 DIAGNOSIS — E669 Obesity, unspecified: Secondary | ICD-10-CM | POA: Insufficient documentation

## 2021-03-23 DIAGNOSIS — J343 Hypertrophy of nasal turbinates: Secondary | ICD-10-CM | POA: Diagnosis not present

## 2021-03-23 DIAGNOSIS — J3489 Other specified disorders of nose and nasal sinuses: Secondary | ICD-10-CM | POA: Diagnosis not present

## 2021-03-23 DIAGNOSIS — Z6841 Body Mass Index (BMI) 40.0 and over, adult: Secondary | ICD-10-CM | POA: Insufficient documentation

## 2021-03-23 HISTORY — PX: ENDOSCOPIC CONCHA BULLOSA RESECTION: SHX6395

## 2021-03-23 HISTORY — PX: SEPTOPLASTY: SHX2393

## 2021-03-23 HISTORY — PX: TURBINATE REDUCTION: SHX6157

## 2021-03-23 SURGERY — SEPTOPLASTY, NOSE
Anesthesia: General | Site: Nose | Laterality: Bilateral

## 2021-03-23 MED ORDER — ACETAMINOPHEN 325 MG PO TABS: 325.0000 mg | ORAL_TABLET | ORAL | Status: DC | PRN

## 2021-03-23 MED ORDER — LIDOCAINE HCL (CARDIAC) PF 100 MG/5ML IV SOSY
PREFILLED_SYRINGE | INTRAVENOUS | Status: DC | PRN
  Administered 2021-03-23: 50 mg via INTRAVENOUS

## 2021-03-23 MED ORDER — ACETAMINOPHEN 160 MG/5ML PO SOLN: 325.0000 mg | ORAL | Status: DC | PRN

## 2021-03-23 MED ORDER — PHENYLEPHRINE HCL 0.5 % NA SOLN
NASAL | Status: DC | PRN
  Administered 2021-03-23: 30 mL via TOPICAL

## 2021-03-23 MED ORDER — CEPHALEXIN 500 MG PO CAPS: 500.0000 mg | ORAL_CAPSULE | Freq: Two times a day (BID) | ORAL | 0 refills | Status: DC

## 2021-03-23 MED ORDER — DEXAMETHASONE SODIUM PHOSPHATE 4 MG/ML IJ SOLN
INTRAMUSCULAR | Status: DC | PRN
  Administered 2021-03-23: 8 mg via INTRAVENOUS

## 2021-03-23 MED ORDER — PROPOFOL 10 MG/ML IV BOLUS
INTRAVENOUS | Status: DC | PRN
  Administered 2021-03-23: 200 mg via INTRAVENOUS

## 2021-03-23 MED ORDER — SUCCINYLCHOLINE CHLORIDE 200 MG/10ML IV SOSY
PREFILLED_SYRINGE | INTRAVENOUS | Status: DC | PRN
  Administered 2021-03-23: 100 mg via INTRAVENOUS

## 2021-03-23 MED ORDER — FENTANYL CITRATE PF 50 MCG/ML IJ SOSY
25.0000 ug | PREFILLED_SYRINGE | INTRAMUSCULAR | Status: DC | PRN
  Administered 2021-03-23: 25 ug via INTRAVENOUS

## 2021-03-23 MED ORDER — FENTANYL CITRATE (PF) 100 MCG/2ML IJ SOLN
INTRAMUSCULAR | Status: DC | PRN
Start: 2021-03-23 — End: 2021-03-23
  Administered 2021-03-23 (×2): 50 ug via INTRAVENOUS
  Administered 2021-03-23 (×2): 25 ug via INTRAVENOUS
  Administered 2021-03-23: 50 ug via INTRAVENOUS

## 2021-03-23 MED ORDER — MIDAZOLAM HCL 5 MG/5ML IJ SOLN
INTRAMUSCULAR | Status: DC | PRN
  Administered 2021-03-23: 2 mg via INTRAVENOUS

## 2021-03-23 MED ORDER — DEXMEDETOMIDINE (PRECEDEX) IN NS 20 MCG/5ML (4 MCG/ML) IV SYRINGE
PREFILLED_SYRINGE | INTRAVENOUS | Status: DC | PRN
  Administered 2021-03-23: 5 ug via INTRAVENOUS
  Administered 2021-03-23: 10 ug via INTRAVENOUS
  Administered 2021-03-23: 5 ug via INTRAVENOUS
  Administered 2021-03-23: 10 ug via INTRAVENOUS

## 2021-03-23 MED ORDER — GLYCOPYRROLATE 0.2 MG/ML IJ SOLN
INTRAMUSCULAR | Status: DC | PRN
  Administered 2021-03-23: .1 mg via INTRAVENOUS

## 2021-03-23 MED ORDER — LACTATED RINGERS IV SOLN: INTRAVENOUS | Status: DC

## 2021-03-23 MED ORDER — OXYMETAZOLINE HCL 0.05 % NA SOLN
2.0000 | Freq: Once | NASAL | Status: AC
  Administered 2021-03-23: 2 via NASAL

## 2021-03-23 MED ORDER — ACETAMINOPHEN 10 MG/ML IV SOLN
1000.0000 mg | Freq: Once | INTRAVENOUS | Status: AC
  Administered 2021-03-23: 1000 mg via INTRAVENOUS

## 2021-03-23 MED ORDER — PREDNISONE 10 MG PO TABS: ORAL_TABLET | ORAL | 0 refills | Status: DC

## 2021-03-23 MED ORDER — OXYCODONE HCL 5 MG PO TABS: 5.0000 mg | ORAL_TABLET | Freq: Once | ORAL | Status: AC | PRN

## 2021-03-23 MED ORDER — HYDROCODONE-ACETAMINOPHEN 5-325 MG PO TABS
1.0000 | ORAL_TABLET | Freq: Four times a day (QID) | ORAL | 0 refills | Status: AC | PRN
Start: 2021-03-23 — End: 2021-03-30

## 2021-03-23 MED ORDER — LIDOCAINE-EPINEPHRINE 1 %-1:100000 IJ SOLN
INTRAMUSCULAR | Status: DC | PRN
  Administered 2021-03-23: 6 mL
  Administered 2021-03-23: 5.5 mL

## 2021-03-23 MED ORDER — DEXTROSE 5 % IV SOLN
2000.0000 mg | Freq: Once | INTRAVENOUS | Status: AC
  Administered 2021-03-23: 2000 mg via INTRAVENOUS

## 2021-03-23 MED ORDER — OXYCODONE HCL 5 MG/5ML PO SOLN
5.0000 mg | Freq: Once | ORAL | Status: AC | PRN
  Administered 2021-03-23: 5 mg via ORAL

## 2021-03-23 SURGICAL SUPPLY — 31 items
CANISTER SUCT 1200ML W/VALVE (MISCELLANEOUS) ×2 IMPLANT
CATH IV 18X1 1/4 SAFELET (CATHETERS) ×1 IMPLANT
COAGULATOR SUCT 8FR VV (MISCELLANEOUS) ×2 IMPLANT
DRAPE HEAD BAR (DRAPES) ×2 IMPLANT
ELECT REM PT RETURN 9FT ADLT (ELECTROSURGICAL) ×2
ELECTRODE REM PT RTRN 9FT ADLT (ELECTROSURGICAL) ×1 IMPLANT
GLOVE SURG GAMMEX PI TX LF 7.5 (GLOVE) ×5 IMPLANT
GOWN STRL REUS W/ TWL LRG LVL3 (GOWN DISPOSABLE) ×1 IMPLANT
GOWN STRL REUS W/TWL LRG LVL3 (GOWN DISPOSABLE) ×2
IV CATH 18X1 1/4 SAFELET (CATHETERS)
KIT TURNOVER KIT A (KITS) ×2 IMPLANT
NDL ANESTHESIA 27G X 3.5 (NEEDLE) ×1 IMPLANT
NDL HYPO 27GX1-1/4 (NEEDLE) ×1 IMPLANT
NEEDLE ANESTHESIA  27G X 3.5 (NEEDLE) ×1
NEEDLE ANESTHESIA 27G X 3.5 (NEEDLE) ×1 IMPLANT
NEEDLE HYPO 27GX1-1/4 (NEEDLE) ×2 IMPLANT
PACK ENT CUSTOM (PACKS) ×2 IMPLANT
PACKING NASAL EPIS 4X2.4 XEROG (MISCELLANEOUS) ×1 IMPLANT
PATTIES SURGICAL .5 X3 (DISPOSABLE) ×2 IMPLANT
SOL ANTI-FOG 6CC FOG-OUT (MISCELLANEOUS) ×1 IMPLANT
SOL FOG-OUT ANTI-FOG 6CC (MISCELLANEOUS) ×1
SPLINT NASAL SEPTAL BLV .50 ST (MISCELLANEOUS) ×2 IMPLANT
STRAP BODY AND KNEE 60X3 (MISCELLANEOUS) ×4 IMPLANT
SUT CHROMIC 3-0 (SUTURE) ×2
SUT CHROMIC 3-0 KS 27XMFL CR (SUTURE) ×1
SUT ETHILON 3-0 KS 30 BLK (SUTURE) ×2 IMPLANT
SUT PLAIN GUT 4-0 (SUTURE) ×2 IMPLANT
SUTURE CHRMC 3-0 KS 27XMFL CR (SUTURE) IMPLANT
SYR 3ML LL SCALE MARK (SYRINGE) ×2 IMPLANT
TOWEL OR 17X26 4PK STRL BLUE (TOWEL DISPOSABLE) ×2 IMPLANT
WATER STERILE IRR 250ML POUR (IV SOLUTION) ×2 IMPLANT

## 2021-03-23 NOTE — Anesthesia Postprocedure Evaluation (Signed)
Anesthesia Post Note  Patient: Timothy Conner  Procedure(s) Performed: SEPTOPLASTY (Bilateral: Nose) BILATERAL PARTIAL INFERIOR TURBINATE REDUCTION (Bilateral: Nose) BILATERAL ENDOSCOPIC TRIMMING CONCHA BULLOSA MIDDLE TURBINATES (Bilateral: Nose)     Patient location during evaluation: PACU Anesthesia Type: General Level of consciousness: awake Pain management: pain level controlled Vital Signs Assessment: post-procedure vital signs reviewed and stable Respiratory status: respiratory function stable Cardiovascular status: stable Postop Assessment: no signs of nausea or vomiting Anesthetic complications: no   No notable events documented.  Veda Canning

## 2021-03-23 NOTE — Anesthesia Preprocedure Evaluation (Signed)
Anesthesia Evaluation  Patient identified by MRN, date of birth, ID band Patient awake    Reviewed: Allergy & Precautions, NPO status   Airway Mallampati: II  TM Distance: >3 FB     Dental   Pulmonary neg pulmonary ROS, neg recent URI,    Pulmonary exam normal        Cardiovascular negative cardio ROS   Rhythm:Regular Rate:Normal     Neuro/Psych    GI/Hepatic negative GI ROS,   Endo/Other  Obesity  Renal/GU      Musculoskeletal   Abdominal   Peds  Hematology   Anesthesia Other Findings   Reproductive/Obstetrics                             Anesthesia Physical Anesthesia Plan  ASA: 2  Anesthesia Plan: General   Post-op Pain Management: Ofirmev IV (intra-op)   Induction: Intravenous  PONV Risk Score and Plan: 2 and Ondansetron, Dexamethasone, Midazolam and Treatment may vary due to age or medical condition  Airway Management Planned: Oral ETT  Additional Equipment:   Intra-op Plan:   Post-operative Plan:   Informed Consent: I have reviewed the patients History and Physical, chart, labs and discussed the procedure including the risks, benefits and alternatives for the proposed anesthesia with the patient or authorized representative who has indicated his/her understanding and acceptance.     Dental advisory given  Plan Discussed with: CRNA  Anesthesia Plan Comments:         Anesthesia Quick Evaluation

## 2021-03-23 NOTE — Transfer of Care (Signed)
Immediate Anesthesia Transfer of Care Note  Patient: LOFTON LEON  Procedure(s) Performed: SEPTOPLASTY (Bilateral: Nose) BILATERAL PARTIAL INFERIOR TURBINATE REDUCTION (Bilateral: Nose) BILATERAL ENDOSCOPIC TRIMMING CONCHA BULLOSA MIDDLE TURBINATES (Bilateral: Nose)  Patient Location: PACU  Anesthesia Type: General  Level of Consciousness: awake, alert  and patient cooperative  Airway and Oxygen Therapy: Patient Spontanous Breathing and Patient connected to supplemental oxygen  Post-op Assessment: Post-op Vital signs reviewed, Patient's Cardiovascular Status Stable, Respiratory Function Stable, Patent Airway and No signs of Nausea or vomiting  Post-op Vital Signs: Reviewed and stable  Complications: No notable events documented.

## 2021-03-23 NOTE — H&P (Signed)
H&P has been reviewed and patient reevaluated, no changes necessary. To be downloaded later.  

## 2021-03-23 NOTE — Op Note (Signed)
03/23/2021  11:55 AM  [id number]   Pre-Op Dx:  Deviated Nasal Septum, Hypertrophic Inferior Turbinates, concha bullosa of bilateral middle turbinates  Post-op Dx: Same  Proc: Nasal Septoplasty, Bilateral Partial Reduction Inferior Turbinates, bilateral endoscopic trimming of concha bullosa of the middle turbinates  Surg:  Timothy Conner Timothy Conner  Anes:  GOT  EBL: 125 mL  Comp: None  Findings: Very deviated septum with spur to the right side posteriorly at the vomer pushing into the middle turbinate.  The ethmoid plate was buckled to the left side narrowing the superior airway here.  The inferior turbinates were very large in the middle turbinates were enlarged as well with concha bullosa, bigger on the right side  Procedure: With the patient in a comfortable supine position,  general orotracheal anesthesia was induced without difficulty.     The patient received preoperative Afrin spray for topical decongestion and vasoconstriction.  Intravenous prophylactic antibiotics were administered.  At an appropriate level, the patient was placed in a semi-sitting position.  Nasal vibrissae were trimmed.   1% Xylocaine with 1:100,000 epinephrine, 6 cc's, was infiltrated into the anterior floor of the nose, into the nasal spine region, into the membranous columella, and finally into the submucoperichondrial plane of the septum on both sides.  Several minutes were allowed for this to take effect.  Cottoniod pledgetts soaked in Afrin and 4% Xylocaine were placed into both nasal cavities and left while the patient was prepped and draped in the standard fashion.  The materials were removed from the nose and observed to be intact and correct in number.  The nose was inspected with a headlight and zero degree scope with the findings as described above.  A left Killian incision was sharply executed and carried down to the quadrangular cartilage. The mucoperichondrium was elelvated along the quadrangular plate back  to the bony-cartilaginous junction. The mucoperiostium was then elevated along the ethmoid plate and the vomer. The boney-catilaginous junction was then split with a freer elevator and the mucoperiosteum was elevated on the opposite side. The mucoperiosteum was then elevated along the maxillary crest as needed to expose the crooked bone of the crest.  Boney spurs of the vomer and maxillary crest were removed with Donavan Foil forceps.  The cartilaginous plate was trimmed along its posterior and inferior borders of about 2 mm of cartilage to free it up inferiorly. Some of the deviated ethmoid plate was then fractured and removed with Takahashi forceps to free up the posterior border of the quadrangular plate and allow it to swing back to the midline. The mucosal flaps were placed back into their anatomic position to allow visualization of the airways. The septum now sat in the midline with an improved airway.  A 3-0 Chromic suture on a Keith needle in used to anchor the inferior septum at the nasal spine with a through and through suture. The mucosal flaps are then sutured together using a through and through whip stitch of 4-0 Plain Gut with a mini-Keith needle. This was used to close the [place] incision as well.   The inferior turbinates were then inspected. An incision was created along the inferior aspect of the left inferior turbinate with removal of some of the inferior soft tissue and bone. Electrocautery was used to control bleeding in the area. The remaining turbinate was then outfractured to open up the airway further. There was no significant bleeding noted. The right turbinate was then trimmed and outfractured in a similar fashion.  The 0 degree scope  was then used to visualize the middle turbinates.  Another 4-1/2 mL of 1% Xylocaine with epi 1: 100,000 was used for infiltration in the anterior and posterior roots of the middle turbinates.  The right middle turbinate was very wide and had a large  concha bullosa.  A Freer elevator was used to divide the turbinate in the midline along its inferior border.  The lateral border of the middle turbinate concha bullosa was then removed.  There is bleeding here which was controlled with electrocautery.  Xerogel was then placed into the middle meatus and help control bleeding and keep the middle turbinate from lateralizing.  The left middle turbinate was then visualized with the 0 degree scope.  It was medialized.  There was a concha bullosa in the upper anterior portion of this.  The lateral wall was fractured and removed with through biting forceps.  Bleeding was controlled with electrocautery around the trimmed edge.  Xerogel was then placed in the middle meatus to again hold it medially and help control bleeding.  The airways were then visualized and showed open passageways on both sides that were significantly improved compared to before surgery. There was no signifcant bleeding. Nasal splints were applied to both sides of the septum using Xomed 0.58mm regular sized splints that were trimmed, and then held in position with a 3-0 Nylon through and through suture.  The patient was turned back over to anesthesia, and awakened, extubated, and taken to the PACU in satisfactory condition.  He tolerated the procedure well.  There were no operative complications.  Dispo:   PACU to home  Plan: Ice, elevation, narcotic analgesia, steroid taper, and prophylactic antibiotics for the duration of indwelling nasal foreign bodies.  We will reevaluate the patient in the office in 6 days and remove the septal splints.  Return to work in 10 days, strenuous activities in two weeks.   Timothy Conner Timothy Conner 03/23/2021 11:55 AM

## 2021-03-23 NOTE — Anesthesia Procedure Notes (Signed)
Procedure Name: Intubation Date/Time: 03/23/2021 10:20 AM Performed by: Mayme Genta, CRNA Pre-anesthesia Checklist: Patient identified, Emergency Drugs available, Suction available, Patient being monitored and Timeout performed Patient Re-evaluated:Patient Re-evaluated prior to induction Oxygen Delivery Method: Circle system utilized Preoxygenation: Pre-oxygenation with 100% oxygen Induction Type: IV induction Ventilation: Mask ventilation without difficulty Laryngoscope Size: Miller and 3 Grade View: Grade I Tube type: Oral Rae Tube size: 7.5 mm Number of attempts: 1 Placement Confirmation: ETT inserted through vocal cords under direct vision, positive ETCO2 and breath sounds checked- equal and bilateral Tube secured with: Tape Dental Injury: Teeth and Oropharynx as per pre-operative assessment

## 2021-03-24 ENCOUNTER — Encounter: Payer: Self-pay | Admitting: Otolaryngology

## 2021-03-28 DIAGNOSIS — J3489 Other specified disorders of nose and nasal sinuses: Secondary | ICD-10-CM | POA: Diagnosis not present

## 2021-04-05 DIAGNOSIS — J3489 Other specified disorders of nose and nasal sinuses: Secondary | ICD-10-CM | POA: Diagnosis not present

## 2021-04-20 DIAGNOSIS — J3489 Other specified disorders of nose and nasal sinuses: Secondary | ICD-10-CM | POA: Diagnosis not present

## 2021-05-03 DIAGNOSIS — J3489 Other specified disorders of nose and nasal sinuses: Secondary | ICD-10-CM | POA: Diagnosis not present

## 2021-07-02 DIAGNOSIS — H6693 Otitis media, unspecified, bilateral: Secondary | ICD-10-CM | POA: Diagnosis not present

## 2021-07-04 DIAGNOSIS — H6982 Other specified disorders of Eustachian tube, left ear: Secondary | ICD-10-CM | POA: Diagnosis not present

## 2021-07-04 DIAGNOSIS — H6692 Otitis media, unspecified, left ear: Secondary | ICD-10-CM | POA: Diagnosis not present

## 2021-08-15 DIAGNOSIS — H9072 Mixed conductive and sensorineural hearing loss, unilateral, left ear, with unrestricted hearing on the contralateral side: Secondary | ICD-10-CM | POA: Diagnosis not present

## 2021-08-15 DIAGNOSIS — H6982 Other specified disorders of Eustachian tube, left ear: Secondary | ICD-10-CM | POA: Diagnosis not present

## 2021-08-21 ENCOUNTER — Encounter: Payer: Self-pay | Admitting: Otolaryngology

## 2021-08-24 ENCOUNTER — Other Ambulatory Visit: Payer: Self-pay

## 2021-08-24 MED ORDER — CIPROFLOXACIN-DEXAMETHASONE 0.3-0.1 % OT SUSP
OTIC | 0 refills | Status: DC
  Filled 2021-08-24: qty 7.5, 7d supply, fill #0
  Filled 2021-08-24: qty 7.5, 5d supply, fill #0

## 2021-08-25 ENCOUNTER — Other Ambulatory Visit: Payer: Self-pay

## 2021-08-28 ENCOUNTER — Other Ambulatory Visit: Payer: Self-pay

## 2021-08-29 ENCOUNTER — Other Ambulatory Visit: Payer: Self-pay

## 2021-08-31 ENCOUNTER — Ambulatory Visit: Payer: 59 | Admitting: Anesthesiology

## 2021-08-31 ENCOUNTER — Ambulatory Visit
Admission: RE | Admit: 2021-08-31 | Discharge: 2021-08-31 | Disposition: A | Discharge status: Home / Self Care | Payer: 59 | Attending: Otolaryngology | Admitting: Otolaryngology

## 2021-08-31 ENCOUNTER — Other Ambulatory Visit: Payer: Self-pay

## 2021-08-31 ENCOUNTER — Encounter
Admission: RE | Disposition: A | Discharge status: Home / Self Care | Payer: Self-pay | Source: Home / Self Care | Attending: Otolaryngology

## 2021-08-31 ENCOUNTER — Encounter: Payer: Self-pay | Admitting: Otolaryngology

## 2021-08-31 DIAGNOSIS — H6983 Other specified disorders of Eustachian tube, bilateral: Secondary | ICD-10-CM | POA: Diagnosis not present

## 2021-08-31 DIAGNOSIS — H6522 Chronic serous otitis media, left ear: Secondary | ICD-10-CM | POA: Diagnosis not present

## 2021-08-31 DIAGNOSIS — Z6841 Body Mass Index (BMI) 40.0 and over, adult: Secondary | ICD-10-CM | POA: Insufficient documentation

## 2021-08-31 DIAGNOSIS — H6592 Unspecified nonsuppurative otitis media, left ear: Secondary | ICD-10-CM | POA: Diagnosis not present

## 2021-08-31 DIAGNOSIS — H6992 Unspecified Eustachian tube disorder, left ear: Secondary | ICD-10-CM | POA: Diagnosis not present

## 2021-08-31 HISTORY — PX: MYRINGOTOMY WITH TUBE PLACEMENT: SHX5663

## 2021-08-31 SURGERY — MYRINGOTOMY WITH TUBE PLACEMENT
Anesthesia: Monitor Anesthesia Care | Site: Ear | Laterality: Left

## 2021-08-31 MED ORDER — ACETAMINOPHEN 500 MG PO TABS
1000.0000 mg | ORAL_TABLET | Freq: Four times a day (QID) | ORAL | Status: DC | PRN
  Administered 2021-08-31: 1000 mg via ORAL

## 2021-08-31 MED ORDER — LIDOCAINE HCL (CARDIAC) PF 100 MG/5ML IV SOSY
PREFILLED_SYRINGE | INTRAVENOUS | Status: DC | PRN
  Administered 2021-08-31: 50 mg via INTRATRACHEAL

## 2021-08-31 MED ORDER — ONDANSETRON HCL 4 MG/2ML IJ SOLN
INTRAMUSCULAR | Status: DC | PRN
  Administered 2021-08-31: 4 mg via INTRAVENOUS

## 2021-08-31 MED ORDER — MIDAZOLAM HCL 5 MG/5ML IJ SOLN
INTRAMUSCULAR | Status: DC | PRN
  Administered 2021-08-31: 2 mg via INTRAVENOUS

## 2021-08-31 MED ORDER — PROPOFOL 10 MG/ML IV BOLUS
INTRAVENOUS | Status: DC | PRN
Start: 2021-08-31 — End: 2021-08-31
  Administered 2021-08-31: 200 mg via INTRAVENOUS

## 2021-08-31 MED ORDER — LACTATED RINGERS IV SOLN: INTRAVENOUS | Status: DC

## 2021-08-31 MED ORDER — CIPROFLOXACIN-DEXAMETHASONE 0.3-0.1 % OT SUSP
OTIC | Status: DC | PRN
  Administered 2021-08-31: 1 [drp] via OTIC

## 2021-08-31 SURGICAL SUPPLY — 10 items
BALL CTTN LRG ABS STRL LF (GAUZE/BANDAGES/DRESSINGS) ×1
BLADE MYRINGOTOMY 6 SPEAR HDL (BLADE) ×2 IMPLANT
CANISTER SUCT 1200ML W/VALVE (MISCELLANEOUS) ×2 IMPLANT
COTTONBALL LRG STERILE PKG (GAUZE/BANDAGES/DRESSINGS) ×2 IMPLANT
GLOVE SURG GAMMEX PI TX LF 7.5 (GLOVE) ×2 IMPLANT
STRAP BODY AND KNEE 60X3 (MISCELLANEOUS) ×2 IMPLANT
TOWEL OR 17X26 4PK STRL BLUE (TOWEL DISPOSABLE) ×2 IMPLANT
TUBE EAR T 1.27X4.5 GO LF (OTOLOGIC RELATED) ×1 IMPLANT
TUBING CONN 6MMX3.1M (TUBING) ×1
TUBING SUCTION CONN 0.25 STRL (TUBING) ×1 IMPLANT

## 2021-08-31 NOTE — Transfer of Care (Addendum)
Immediate Anesthesia Transfer of Care Note  Patient: Timothy Conner  Procedure(s) Performed: MYRINGOTOMY WITH T-TUBE PLACEMENT (Left: Ear)  Patient Location: PACU  Anesthesia Type: General  Level of Consciousness: awake, alert  and patient cooperative  Airway and Oxygen Therapy: Patient Spontanous Breathing and Patient connected to supplemental oxygen  Post-op Assessment: Post-op Vital signs reviewed, Patient's Cardiovascular Status Stable, Respiratory Function Stable, Patent Airway and No signs of Nausea or vomiting  Post-op Vital Signs: Reviewed and stable  Complications: There were no known notable events for this encounter.

## 2021-08-31 NOTE — Anesthesia Preprocedure Evaluation (Addendum)
Anesthesia Evaluation  Patient identified by MRN, date of birth, ID band Patient awake    History of Anesthesia Complications Negative for: history of anesthetic complications  Airway Mallampati: III  TM Distance: >3 FB Neck ROM: Full    Dental no notable dental hx.    Pulmonary neg pulmonary ROS,           Cardiovascular Exercise Tolerance: Good negative cardio ROS Normal cardiovascular exam     Neuro/Psych negative neurological ROS     GI/Hepatic negative GI ROS, Neg liver ROS,   Endo/Other  Morbid obesity (BMI 41)  Renal/GU negative Renal ROS     Musculoskeletal negative musculoskeletal ROS (+)   Abdominal   Peds  Hematology negative hematology ROS (+)   Anesthesia Other Findings   Reproductive/Obstetrics                             Anesthesia Physical Anesthesia Plan  ASA: 3  Anesthesia Plan: MAC   Post-op Pain Management: Minimal or no pain anticipated   Induction: Intravenous  PONV Risk Score and Plan: 1 and TIVA, Propofol infusion and Treatment may vary due to age or medical condition  Airway Management Planned: Nasal Cannula and Natural Airway  Additional Equipment: None  Intra-op Plan:   Post-operative Plan:   Informed Consent: I have reviewed the patients History and Physical, chart, labs and discussed the procedure including the risks, benefits and alternatives for the proposed anesthesia with the patient or authorized representative who has indicated his/her understanding and acceptance.       Plan Discussed with: CRNA  Anesthesia Plan Comments:         Anesthesia Quick Evaluation

## 2021-08-31 NOTE — Anesthesia Postprocedure Evaluation (Signed)
Anesthesia Post Note  Patient: [name]  Procedure(s) Performed: MYRINGOTOMY WITH T-TUBE PLACEMENT (Left: Ear)     Patient location during evaluation: PACU Anesthesia Type: MAC Level of consciousness: awake and alert Pain management: pain level controlled Vital Signs Assessment: post-procedure vital signs reviewed and stable Respiratory status: spontaneous breathing, nonlabored ventilation, respiratory function stable and patient connected to nasal cannula oxygen Cardiovascular status: blood pressure returned to baseline and stable Postop Assessment: no apparent nausea or vomiting Anesthetic complications: no   There were no known notable events for this encounter.  Adele Barthel Roberts Bon

## 2021-08-31 NOTE — H&P (Signed)
H&P has been reviewed and patient reevaluated, no changes necessary. To be downloaded later.  

## 2021-08-31 NOTE — Op Note (Signed)
08/31/2021  11:11 AM    Darci Needle  [id number]   Pre-Op Dx: Verdie Drown tube dysfunction and chronic left serous otitis media  Post-op Dx: Same  Proc: Left myringotomy and placement of a T-tube  Surg:  Huey Romans  Anes:  Gen  EBL: Minimal  Comp: None  Findings: The right T-tube was in good position.  The left eardrum was retracted and part of the anterior drum was scarred to the promontory.  This was lifted up and the tube was placed behind this  Procedure: Patient was brought to the operating room and placed in the supine position.  He was given general anesthesia by LMA.  Once the patient was fully asleep the left ear canal was visualized under high-power March scope.  There was no wax in the ear canal that needed to be cleaned out.  The eardrum was thin and translucent and was retracted.  The anterior inferior quadrant appeared to be stuck down to the promontory.  There is more space behind the eardrum in the anterior superior quadrant.  A small incision was made in the eardrum here and the T-tube had 1 leg placed through the hole.  The other leg was then bent and placed through the tube to go downward into the lower anterior quadrant.  This actually lifted up the eardrum some and pulled it away from the promontory to create a little more space here in the middle ear.  The T-tube was sit in a good position.  Ciprodex drops were placed in the ear canal.  The right ear canal was visualized under high-power microscope.  The ear canal was cleaned and minimal wax was removed.  The eardrum looked healthy and a T-tube was in good position and patent.  The patient tolerated the procedure well.  He was awakened and taken to the recovery room in satisfactory condition.  There were no operative complications.  Dispo:   To PACU to be discharged home.  Plan: To follow-up in the office in a couple of weeks with a hearing test.  He will use Ciprodex drops in his left ear 3 times a day for  3 days to make sure the T-tube stays clean.  He does not need any drops in the right ear.  He can use Tylenol for pain.  Elon Alas Korri Ask  08/31/2021 11:11 AM

## 2021-08-31 NOTE — Anesthesia Procedure Notes (Signed)
Procedure Name: LMA Insertion Date/Time: 08/31/2021 11:01 AM  Performed by: Mayme Genta, CRNAPre-anesthesia Checklist: Patient identified, Emergency Drugs available, Suction available, Timeout performed and Patient being monitored Patient Re-evaluated:Patient Re-evaluated prior to induction Oxygen Delivery Method: Circle system utilized Preoxygenation: Pre-oxygenation with 100% oxygen Induction Type: IV induction LMA: LMA inserted LMA Size: 4.0 Number of attempts: 1 Placement Confirmation: positive ETCO2 and breath sounds checked- equal and bilateral Tube secured with: Tape

## 2021-09-04 ENCOUNTER — Encounter: Payer: Self-pay | Admitting: Otolaryngology

## 2021-09-18 DIAGNOSIS — H6982 Other specified disorders of Eustachian tube, left ear: Secondary | ICD-10-CM | POA: Diagnosis not present

## 2021-10-10 DIAGNOSIS — Z6841 Body Mass Index (BMI) 40.0 and over, adult: Secondary | ICD-10-CM | POA: Diagnosis not present

## 2021-10-10 DIAGNOSIS — J029 Acute pharyngitis, unspecified: Secondary | ICD-10-CM | POA: Diagnosis not present

## 2021-11-13 DIAGNOSIS — Z6841 Body Mass Index (BMI) 40.0 and over, adult: Secondary | ICD-10-CM | POA: Diagnosis not present

## 2022-02-23 ENCOUNTER — Ambulatory Visit
Admission: EM | Admit: 2022-02-23 | Discharge: 2022-02-23 | Disposition: A | Discharge status: Home / Self Care | Payer: 59 | Attending: Family Medicine | Admitting: Family Medicine

## 2022-02-23 ENCOUNTER — Encounter: Payer: Self-pay | Admitting: Emergency Medicine

## 2022-02-23 DIAGNOSIS — R6889 Other general symptoms and signs: Secondary | ICD-10-CM | POA: Diagnosis not present

## 2022-02-23 DIAGNOSIS — Z1152 Encounter for screening for COVID-19: Secondary | ICD-10-CM | POA: Insufficient documentation

## 2022-02-23 LAB — SARS CORONAVIRUS 2 BY RT PCR: SARS Coronavirus 2 by RT PCR: NEGATIVE

## 2022-02-23 MED ORDER — PROMETHAZINE-DM 6.25-15 MG/5ML PO SYRP: 5.0000 mL | ORAL_SOLUTION | Freq: Four times a day (QID) | ORAL | 0 refills | Status: AC | PRN

## 2022-02-23 NOTE — ED Provider Notes (Signed)
[facility]    CSN: [id number] Arrival date & time: 02/23/22  0947      History   Chief Complaint Chief Complaint  Patient presents with   Cough   Fever   Generalized Body Aches    HPI Timothy Conner is a 26 y.o. male.   HPI   Timothy Conner presents for fever, cough, body aches with diarrhea and nausea. Tmax 100.5 F.  Symptoms started on Tuesday after getting home from work.  He has been coughing up bright yellow film that is making his head hurt.  He send taking over-the-counter medications without relief.  Denies vomiting, ear pain, shortness of breath or chest discomfort.  He currently uses smokeless tobacco.  Of note, his wife and children are here with similar symptoms.  His symptoms are improving. Cough keeps him up at night.       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    History of frequent ear infections in childhood     Patient Active Problem List   Diagnosis Date Noted   Right ureteral stone 09/01/2015    Past Surgical History:  Procedure Laterality Date   ENDOSCOPIC CONCHA BULLOSA RESECTION Bilateral 03/23/2021   Procedure: BILATERAL ENDOSCOPIC TRIMMING CONCHA BULLOSA MIDDLE TURBINATES;  Surgeon: Margaretha Sheffield, MD;  Location: [place];  Service: ENT;  Laterality: Bilateral;   MYRINGOTOMY WITH TUBE PLACEMENT Left 08/31/2021   Procedure: MYRINGOTOMY WITH T-TUBE PLACEMENT;  Surgeon: Margaretha Sheffield, MD;  Location: [place];  Service: ENT;  Laterality: Left;   SEPTOPLASTY Bilateral 03/23/2021   Procedure: SEPTOPLASTY;  Surgeon: Margaretha Sheffield, MD;  Location: [place];  Service: ENT;  Laterality: Bilateral;   SKIN BIOPSY     right neck, benign tumor removed   TUMOR REMOVAL     TURBINATE REDUCTION Bilateral 03/23/2021   Procedure: BILATERAL PARTIAL INFERIOR TURBINATE REDUCTION;  Surgeon: Margaretha Sheffield, MD;  Location: [place];  Service: ENT;  Laterality: Bilateral;    TYMPANOSTOMY TUBE PLACEMENT     bilateral, replaced multiple times; right TM currently plan removal       Home Medications    Prior to Admission medications   Medication Sig Start Date End Date Taking? Authorizing Provider  promethazine-dextromethorphan (PROMETHAZINE-DM) 6.25-15 MG/5ML syrup Take 5 mLs by mouth 4 (four) times daily as needed. 02/23/22  Yes Lyndee Hensen, DO    Family History Family History  Problem Relation Age of Onset   Prostate cancer Maternal Grandfather    Kidney cancer Neg Hx    Kidney disease Neg Hx     Social History Social History   Tobacco Use   Smoking status: Never   Smokeless tobacco: Current    Types: Chew  Vaping Use   Vaping Use: Never used  Substance Use Topics   Alcohol use: Yes    Alcohol/week: 1.0 standard drink of alcohol    Types: 1 Cans of beer per week    Comment: social   Drug use: No     Allergies   Patient has no known allergies.   Review of Systems Review of Systems: negative unless otherwise stated in HPI.      Physical Exam Triage Vital Signs ED Triage Vitals  Enc Vitals Group     BP 02/23/22 1132 123/80     Pulse Rate 02/23/22 1132 (!) 103     Resp 02/23/22 1132 15     Temp 02/23/22 1132  99 F (37.2 C)     Temp Source 02/23/22 1132 Oral     SpO2 02/23/22 1132 98 %     Weight 02/23/22 1130 281 lb 1.4 oz (127.5 kg)     Height 02/23/22 1130 '5\' 9"'$  (1.753 m)     Head Circumference --      Peak Flow --      Pain Score 02/23/22 1130 0     Pain Loc --      Pain Edu? --      Excl. in [place]? --    No data found.  Updated Vital Signs BP 123/80 (BP Location: Left Arm)   Pulse (!) 103   Temp 99 F (37.2 C) (Oral)   Resp 15   Ht '5\' 9"'$  (1.753 m)   Wt 127.5 kg   SpO2 98%   BMI 41.51 kg/m   Visual Acuity Right Eye Distance:   Left Eye Distance:   Bilateral Distance:    Right Eye Near:   Left Eye Near:    Bilateral Near:     Physical Exam GEN:     alert, non-toxic appearing male in no distress     HENT:  mucus membranes moist, oropharyngeal without lesions or exudate, no tonsillar hypertrophy, mild oropharyngeal erythema, clear nasal discharge EYES:   pupils equal and reactive, no scleral injection or discharge NECK:  normal ROM,no meningismus   RESP:  no increased work of breathing, clear to auscultation bilaterally CVS:   regular rate and rhythm Skin:   warm and dry, no rash on visible skin    UC Treatments / Results  Labs (all labs ordered are listed, but only abnormal results are displayed) Labs Reviewed  SARS CORONAVIRUS 2 BY RT PCR    EKG   Radiology No results found.  Procedures Procedures (including critical care time)  Medications Ordered in UC Medications - No data to display  Initial Impression / Assessment and Plan / UC Course  I have reviewed the triage vital signs and the nursing notes.  Pertinent labs & imaging results that were available during my care of the patient were reviewed by me and considered in my medical decision making (see chart for details).       Pt is a 26 y.o. male who presents for 4 days of respiratory symptoms. Timothy Conner is afebrile here without recent antipyretics. Satting well on room air. Overall pt is non-toxic appearing, well hydrated, without respiratory distress. Pulmonary exam is unremarkable.  COVID  testing obtained and was negative. Influenza testing deferred as clinically he improving and is outside of the treatment window.  I suspect pt and his family have influenza.  History consistent with viral respiratory illness. Discussed symptomatic treatment. Promethazine DM for cough.  Explained lack of efficacy of antibiotics in viral disease.  Typical duration of symptoms discussed.   Return and ED precautions given and voiced understanding. Discussed MDM, treatment plan and plan for follow-up with patient who agrees with plan.     Final Clinical Impressions(s) / UC Diagnoses   Final diagnoses:  Flu-like symptoms      Discharge Instructions      Your COVID test is negative. If your were prescribed medication, stop by the pharmacy to pick them up.  You can take Tylenol and/or Ibuprofen as needed for fever reduction and pain relief.    For cough: honey 1/2 to 1 teaspoon (you can dilute the honey in water or another fluid).  You can also use guaifenesin and dextromethorphan  for cough. You can use a humidifier for chest congestion and cough.  If you don't have a humidifier, you can sit in the bathroom with the hot shower running.      For sore throat: try warm salt water gargles, Mucinex sore throat cough drops or cepacol lozenges, throat spray, warm tea or water with lemon/honey, popsicles or ice, or OTC cold relief medicine for throat discomfort. You can also purchase chloraseptic spray at the pharmacy or dollar store.   For congestion: take a daily anti-histamine like Zyrtec, Claritin, and a oral decongestant, such as pseudoephedrine.  You can also use Flonase 1-2 sprays in each nostril daily. Afrin is also a good option, if you do not have high blood pressure.    It is important to stay hydrated: drink plenty of fluids (water, gatorade/powerade/pedialyte, juices, or teas) to keep your throat moisturized and help further relieve irritation/discomfort.    Return or go to the Emergency Department if symptoms worsen or do not improve in the next few days     ED Prescriptions     Medication Sig Dispense Auth. Provider   promethazine-dextromethorphan (PROMETHAZINE-DM) 6.25-15 MG/5ML syrup Take 5 mLs by mouth 4 (four) times daily as needed. 118 mL Lyndee Hensen, DO      PDMP not reviewed this encounter.   Lyndee Hensen, DO 02/23/22 1431

## 2022-02-23 NOTE — Discharge Instructions (Signed)
Your COVID test is negative. If your were prescribed medication, stop by the pharmacy to pick them up.  You can take Tylenol and/or Ibuprofen as needed for fever reduction and pain relief.    For cough: honey 1/2 to 1 teaspoon (you can dilute the honey in water or another fluid).  You can also use guaifenesin and dextromethorphan for cough. You can use a humidifier for chest congestion and cough.  If you don't have a humidifier, you can sit in the bathroom with the hot shower running.      For sore throat: try warm salt water gargles, Mucinex sore throat cough drops or cepacol lozenges, throat spray, warm tea or water with lemon/honey, popsicles or ice, or OTC cold relief medicine for throat discomfort. You can also purchase chloraseptic spray at the pharmacy or dollar store.   For congestion: take a daily anti-histamine like Zyrtec, Claritin, and a oral decongestant, such as pseudoephedrine.  You can also use Flonase 1-2 sprays in each nostril daily. Afrin is also a good option, if you do not have high blood pressure.    It is important to stay hydrated: drink plenty of fluids (water, gatorade/powerade/pedialyte, juices, or teas) to keep your throat moisturized and help further relieve irritation/discomfort.    Return or go to the Emergency Department if symptoms worsen or do not improve in the next few days

## 2022-02-23 NOTE — ED Triage Notes (Signed)
Patient c/o chills, cough, congestion, and fever on Tuesday.

## 2022-12-31 DIAGNOSIS — R202 Paresthesia of skin: Secondary | ICD-10-CM | POA: Diagnosis not present

## 2022-12-31 DIAGNOSIS — M9901 Segmental and somatic dysfunction of cervical region: Secondary | ICD-10-CM | POA: Diagnosis not present

## 2022-12-31 DIAGNOSIS — M7918 Myalgia, other site: Secondary | ICD-10-CM | POA: Diagnosis not present

## 2022-12-31 DIAGNOSIS — M5411 Radiculopathy, occipito-atlanto-axial region: Secondary | ICD-10-CM | POA: Diagnosis not present

## 2022-12-31 DIAGNOSIS — M9900 Segmental and somatic dysfunction of head region: Secondary | ICD-10-CM | POA: Diagnosis not present

## 2022-12-31 DIAGNOSIS — M5413 Radiculopathy, cervicothoracic region: Secondary | ICD-10-CM | POA: Diagnosis not present

## 2023-01-02 DIAGNOSIS — M7918 Myalgia, other site: Secondary | ICD-10-CM | POA: Diagnosis not present

## 2023-01-02 DIAGNOSIS — M9900 Segmental and somatic dysfunction of head region: Secondary | ICD-10-CM | POA: Diagnosis not present

## 2023-01-02 DIAGNOSIS — M5411 Radiculopathy, occipito-atlanto-axial region: Secondary | ICD-10-CM | POA: Diagnosis not present

## 2023-01-02 DIAGNOSIS — M9901 Segmental and somatic dysfunction of cervical region: Secondary | ICD-10-CM | POA: Diagnosis not present

## 2023-01-02 DIAGNOSIS — R202 Paresthesia of skin: Secondary | ICD-10-CM | POA: Diagnosis not present

## 2023-01-02 DIAGNOSIS — M5413 Radiculopathy, cervicothoracic region: Secondary | ICD-10-CM | POA: Diagnosis not present

## 2023-01-07 DIAGNOSIS — M5411 Radiculopathy, occipito-atlanto-axial region: Secondary | ICD-10-CM | POA: Diagnosis not present

## 2023-01-07 DIAGNOSIS — M7918 Myalgia, other site: Secondary | ICD-10-CM | POA: Diagnosis not present

## 2023-01-07 DIAGNOSIS — M5413 Radiculopathy, cervicothoracic region: Secondary | ICD-10-CM | POA: Diagnosis not present

## 2023-01-07 DIAGNOSIS — M9900 Segmental and somatic dysfunction of head region: Secondary | ICD-10-CM | POA: Diagnosis not present

## 2023-01-07 DIAGNOSIS — R202 Paresthesia of skin: Secondary | ICD-10-CM | POA: Diagnosis not present

## 2023-01-07 DIAGNOSIS — M9901 Segmental and somatic dysfunction of cervical region: Secondary | ICD-10-CM | POA: Diagnosis not present

## 2023-01-21 DIAGNOSIS — M7918 Myalgia, other site: Secondary | ICD-10-CM | POA: Diagnosis not present

## 2023-01-21 DIAGNOSIS — M5411 Radiculopathy, occipito-atlanto-axial region: Secondary | ICD-10-CM | POA: Diagnosis not present

## 2023-01-21 DIAGNOSIS — M9900 Segmental and somatic dysfunction of head region: Secondary | ICD-10-CM | POA: Diagnosis not present

## 2023-01-21 DIAGNOSIS — M5413 Radiculopathy, cervicothoracic region: Secondary | ICD-10-CM | POA: Diagnosis not present

## 2023-01-21 DIAGNOSIS — R202 Paresthesia of skin: Secondary | ICD-10-CM | POA: Diagnosis not present

## 2023-01-21 DIAGNOSIS — M9901 Segmental and somatic dysfunction of cervical region: Secondary | ICD-10-CM | POA: Diagnosis not present

## 2023-01-23 DIAGNOSIS — R202 Paresthesia of skin: Secondary | ICD-10-CM | POA: Diagnosis not present

## 2023-01-23 DIAGNOSIS — M5413 Radiculopathy, cervicothoracic region: Secondary | ICD-10-CM | POA: Diagnosis not present

## 2023-01-23 DIAGNOSIS — M9900 Segmental and somatic dysfunction of head region: Secondary | ICD-10-CM | POA: Diagnosis not present

## 2023-01-23 DIAGNOSIS — M9901 Segmental and somatic dysfunction of cervical region: Secondary | ICD-10-CM | POA: Diagnosis not present

## 2023-01-23 DIAGNOSIS — M7918 Myalgia, other site: Secondary | ICD-10-CM | POA: Diagnosis not present

## 2023-01-23 DIAGNOSIS — M5411 Radiculopathy, occipito-atlanto-axial region: Secondary | ICD-10-CM | POA: Diagnosis not present

## 2023-04-24 DIAGNOSIS — H66001 Acute suppurative otitis media without spontaneous rupture of ear drum, right ear: Secondary | ICD-10-CM | POA: Diagnosis not present
